# Patient Record
Sex: Female | Born: 1965 | Race: Black or African American | Hispanic: No | Marital: Single | Smoking: Never smoker
Health system: Southern US, Community
[De-identification: ages and names within clinical notes are randomized; demographics above are authoritative.]

## PROBLEM LIST (undated history)

## (undated) DIAGNOSIS — E669 Obesity, unspecified: Secondary | ICD-10-CM

## (undated) DIAGNOSIS — L709 Acne, unspecified: Secondary | ICD-10-CM

## (undated) DIAGNOSIS — E785 Hyperlipidemia, unspecified: Secondary | ICD-10-CM

## (undated) DIAGNOSIS — T7840XA Allergy, unspecified, initial encounter: Secondary | ICD-10-CM

## (undated) HISTORY — DX: Acne, unspecified: L70.9

## (undated) HISTORY — DX: Hyperlipidemia, unspecified: E78.5

## (undated) HISTORY — DX: Allergy, unspecified, initial encounter: T78.40XA

## (undated) HISTORY — DX: Obesity, unspecified: E66.9

---

## 1998-04-13 ENCOUNTER — Other Ambulatory Visit: Admission: RE | Admit: 1998-04-13 | Discharge: 1998-04-13 | Payer: Self-pay | Admitting: *Deleted

## 1999-04-12 ENCOUNTER — Other Ambulatory Visit: Admission: RE | Admit: 1999-04-12 | Discharge: 1999-04-12 | Payer: Self-pay | Admitting: *Deleted

## 2000-04-15 ENCOUNTER — Other Ambulatory Visit: Admission: RE | Admit: 2000-04-15 | Discharge: 2000-04-15 | Payer: Self-pay | Admitting: *Deleted

## 2000-05-28 ENCOUNTER — Other Ambulatory Visit: Admission: RE | Admit: 2000-05-28 | Discharge: 2000-05-28 | Payer: Self-pay | Admitting: *Deleted

## 2001-04-17 ENCOUNTER — Other Ambulatory Visit: Admission: RE | Admit: 2001-04-17 | Discharge: 2001-04-17 | Payer: Self-pay | Admitting: *Deleted

## 2002-04-27 ENCOUNTER — Other Ambulatory Visit: Admission: RE | Admit: 2002-04-27 | Discharge: 2002-04-27 | Payer: Self-pay | Admitting: Family Medicine

## 2003-06-29 ENCOUNTER — Other Ambulatory Visit: Admission: RE | Admit: 2003-06-29 | Discharge: 2003-06-29 | Payer: Self-pay | Admitting: Family Medicine

## 2003-11-07 ENCOUNTER — Other Ambulatory Visit: Admission: RE | Admit: 2003-11-07 | Discharge: 2003-11-07 | Payer: Self-pay | Admitting: Obstetrics and Gynecology

## 2004-12-14 ENCOUNTER — Ambulatory Visit: Payer: Self-pay | Admitting: Family Medicine

## 2005-02-28 ENCOUNTER — Other Ambulatory Visit: Admission: RE | Admit: 2005-02-28 | Discharge: 2005-02-28 | Payer: Self-pay | Admitting: Family Medicine

## 2005-02-28 ENCOUNTER — Ambulatory Visit: Payer: Self-pay | Admitting: Family Medicine

## 2005-02-28 ENCOUNTER — Encounter: Payer: Self-pay | Admitting: Family Medicine

## 2005-03-15 ENCOUNTER — Encounter: Admission: RE | Admit: 2005-03-15 | Discharge: 2005-03-15 | Payer: Self-pay | Admitting: Family Medicine

## 2005-04-04 ENCOUNTER — Encounter: Admission: RE | Admit: 2005-04-04 | Discharge: 2005-04-04 | Payer: Self-pay | Admitting: Family Medicine

## 2005-04-12 ENCOUNTER — Ambulatory Visit: Payer: Self-pay | Admitting: Family Medicine

## 2006-03-18 ENCOUNTER — Encounter: Admission: RE | Admit: 2006-03-18 | Discharge: 2006-03-18 | Payer: Self-pay | Admitting: Family Medicine

## 2006-05-01 ENCOUNTER — Other Ambulatory Visit: Admission: RE | Admit: 2006-05-01 | Discharge: 2006-05-01 | Payer: Self-pay | Admitting: Family Medicine

## 2006-05-01 ENCOUNTER — Encounter: Payer: Self-pay | Admitting: Family Medicine

## 2006-05-01 ENCOUNTER — Ambulatory Visit: Payer: Self-pay | Admitting: Family Medicine

## 2006-05-01 LAB — CONVERTED CEMR LAB
ALT: 18 units/L (ref 0–40)
Albumin: 3.5 g/dL (ref 3.5–5.2)
Alkaline Phosphatase: 40 units/L (ref 39–117)
BUN: 13 mg/dL (ref 6–23)
Basophils Absolute: 0 10*3/uL (ref 0.0–0.1)
Cholesterol: 237 mg/dL (ref 0–200)
Direct LDL: 148.1 mg/dL
Eosinophils Relative: 1.2 % (ref 0.0–5.0)
Glucose, Bld: 86 mg/dL (ref 70–99)
HCT: 37.4 % (ref 36.0–46.0)
HDL: 72 mg/dL (ref >39.0)
MCHC: 34.9 g/dL (ref 30.0–36.0)
Monocytes Absolute: 0.4 10*3/uL (ref 0.2–0.7)
Neutro Abs: 3.2 10*3/uL (ref 1.4–7.7)
Neutrophils Relative %: 72.5 % (ref 43.0–77.0)
Pap Smear: NORMAL
Total Bilirubin: 0.8 mg/dL (ref 0.3–1.2)
Total Protein: 6.6 g/dL (ref 6.0–8.3)
VLDL: 29 mg/dL (ref 0–40)
WBC: 4.5 10*3/uL (ref 4.5–10.5)

## 2007-03-24 ENCOUNTER — Encounter: Admission: RE | Admit: 2007-03-24 | Discharge: 2007-03-24 | Payer: Self-pay | Admitting: Family Medicine

## 2007-03-26 ENCOUNTER — Encounter (INDEPENDENT_AMBULATORY_CARE_PROVIDER_SITE_OTHER): Payer: Self-pay | Admitting: *Deleted

## 2007-05-01 ENCOUNTER — Encounter: Payer: Self-pay | Admitting: Family Medicine

## 2007-05-01 DIAGNOSIS — L708 Other acne: Secondary | ICD-10-CM

## 2007-05-01 DIAGNOSIS — J309 Allergic rhinitis, unspecified: Secondary | ICD-10-CM | POA: Insufficient documentation

## 2007-05-01 DIAGNOSIS — E785 Hyperlipidemia, unspecified: Secondary | ICD-10-CM | POA: Insufficient documentation

## 2007-05-04 ENCOUNTER — Ambulatory Visit: Payer: Self-pay | Admitting: Family Medicine

## 2007-05-04 ENCOUNTER — Encounter: Payer: Self-pay | Admitting: Family Medicine

## 2007-05-04 ENCOUNTER — Other Ambulatory Visit: Admission: RE | Admit: 2007-05-04 | Discharge: 2007-05-04 | Payer: Self-pay | Admitting: Family Medicine

## 2007-05-07 LAB — CONVERTED CEMR LAB
ALT: 23 units/L (ref 0–35)
Albumin: 3.8 g/dL (ref 3.5–5.2)
Alkaline Phosphatase: 37 units/L — ABNORMAL LOW (ref 39–117)
BUN: 7 mg/dL (ref 6–23)
Basophils Absolute: 0 10*3/uL (ref 0.0–0.1)
Basophils Relative: 0 % (ref 0.0–1.0)
Bilirubin, Direct: 0.1 mg/dL (ref 0.0–0.3)
Creatinine, Ser: 0.7 mg/dL (ref 0.4–1.2)
GFR calc Af Amer: 117 mL/min
GFR calc non Af Amer: 97 mL/min
Hemoglobin: 12.5 g/dL (ref 12.0–15.0)
MCHC: 33 g/dL (ref 30.0–36.0)
MCV: 96.3 fL (ref 78.0–100.0)
Neutrophils Relative %: 69.9 % (ref 43.0–77.0)
Platelets: 226 10*3/uL (ref 150–400)
Potassium: 3.8 meq/L (ref 3.5–5.1)
RBC: 3.95 M/uL (ref 3.87–5.11)
RDW: 11.9 % (ref 11.5–14.6)
Sodium: 139 meq/L (ref 135–145)
Total Bilirubin: 0.7 mg/dL (ref 0.3–1.2)
Total CHOL/HDL Ratio: 4.2
Total Protein: 7.2 g/dL (ref 6.0–8.3)
Triglycerides: 144 mg/dL (ref 0–149)

## 2007-05-11 ENCOUNTER — Encounter (INDEPENDENT_AMBULATORY_CARE_PROVIDER_SITE_OTHER): Payer: Self-pay | Admitting: *Deleted

## 2007-07-06 ENCOUNTER — Ambulatory Visit: Payer: Self-pay | Admitting: Family Medicine

## 2007-07-08 LAB — CONVERTED CEMR LAB
Cholesterol: 271 mg/dL (ref 0–200)
HDL: 61.4 mg/dL (ref >39.0)

## 2007-08-03 ENCOUNTER — Ambulatory Visit: Payer: Self-pay | Admitting: Family Medicine

## 2007-08-03 DIAGNOSIS — E669 Obesity, unspecified: Secondary | ICD-10-CM

## 2007-09-08 ENCOUNTER — Ambulatory Visit: Payer: Self-pay | Admitting: Family Medicine

## 2007-09-09 LAB — CONVERTED CEMR LAB
ALT: 28 units/L (ref 0–35)
Cholesterol: 223 mg/dL (ref 0–200)
Direct LDL: 136.1 mg/dL
HDL: 62.9 mg/dL (ref >39.0)
Total CHOL/HDL Ratio: 3.5
Triglycerides: 137 mg/dL (ref 0–149)
VLDL: 27 mg/dL (ref 0–40)

## 2007-12-08 ENCOUNTER — Ambulatory Visit: Payer: Self-pay | Admitting: Family Medicine

## 2007-12-11 LAB — CONVERTED CEMR LAB
ALT: 21 units/L (ref 0–35)
Direct LDL: 131.2 mg/dL

## 2008-03-24 ENCOUNTER — Encounter: Admission: RE | Admit: 2008-03-24 | Discharge: 2008-03-24 | Payer: Self-pay | Admitting: Family Medicine

## 2008-03-25 ENCOUNTER — Encounter (INDEPENDENT_AMBULATORY_CARE_PROVIDER_SITE_OTHER): Payer: Self-pay | Admitting: *Deleted

## 2008-05-04 ENCOUNTER — Other Ambulatory Visit: Admission: RE | Admit: 2008-05-04 | Discharge: 2008-05-04 | Payer: Self-pay | Admitting: Family Medicine

## 2008-05-04 ENCOUNTER — Encounter: Payer: Self-pay | Admitting: Family Medicine

## 2008-05-04 ENCOUNTER — Ambulatory Visit: Payer: Self-pay | Admitting: Family Medicine

## 2008-06-17 ENCOUNTER — Ambulatory Visit: Payer: Self-pay | Admitting: Family Medicine

## 2008-06-20 LAB — CONVERTED CEMR LAB
ALT: 17 units/L (ref 0–35)
Albumin: 3.5 g/dL (ref 3.5–5.2)
Basophils Relative: 0.1 % (ref 0.0–3.0)
Bilirubin, Direct: 0 mg/dL (ref 0.0–0.3)
CO2: 27 meq/L (ref 19–32)
Chloride: 110 meq/L (ref 96–112)
Eosinophils Absolute: 0.1 10*3/uL (ref 0.0–0.7)
Eosinophils Relative: 1.7 % (ref 0.0–5.0)
GFR calc non Af Amer: 116.55 mL/min (ref >60)
Glucose, Bld: 74 mg/dL (ref 70–99)
Lymphocytes Relative: 23.8 % (ref 12.0–46.0)
Platelets: 172 10*3/uL (ref 150.0–400.0)
RDW: 11.4 % — ABNORMAL LOW (ref 11.5–14.6)
Sodium: 139 meq/L (ref 135–145)
TSH: 2.9 microintl units/mL (ref 0.35–5.50)
Total CHOL/HDL Ratio: 4
Total Protein: 6.6 g/dL (ref 6.0–8.3)

## 2008-08-10 ENCOUNTER — Encounter: Payer: Self-pay | Admitting: Family Medicine

## 2008-08-10 ENCOUNTER — Ambulatory Visit: Payer: Self-pay | Admitting: Family Medicine

## 2008-08-10 ENCOUNTER — Other Ambulatory Visit: Admission: RE | Admit: 2008-08-10 | Discharge: 2008-08-10 | Payer: Self-pay | Admitting: Family Medicine

## 2008-08-10 DIAGNOSIS — R87610 Atypical squamous cells of undetermined significance on cytologic smear of cervix (ASC-US): Secondary | ICD-10-CM

## 2008-08-16 ENCOUNTER — Encounter (INDEPENDENT_AMBULATORY_CARE_PROVIDER_SITE_OTHER): Payer: Self-pay | Admitting: *Deleted

## 2008-09-22 ENCOUNTER — Ambulatory Visit: Payer: Self-pay | Admitting: Family Medicine

## 2008-09-26 LAB — CONVERTED CEMR LAB
ALT: 18 units/L (ref 0–35)
Cholesterol: 254 mg/dL — ABNORMAL HIGH (ref 0–200)
Direct LDL: 176.2 mg/dL
Triglycerides: 94 mg/dL (ref 0.0–149.0)

## 2008-11-16 ENCOUNTER — Ambulatory Visit: Payer: Self-pay | Admitting: Family Medicine

## 2008-11-17 LAB — CONVERTED CEMR LAB
HDL: 68.3 mg/dL (ref >39.00)
LDL Cholesterol: 108 mg/dL — ABNORMAL HIGH (ref 0–99)
Triglycerides: 78 mg/dL (ref 0.0–149.0)
VLDL: 15.6 mg/dL (ref 0.0–40.0)

## 2009-03-29 ENCOUNTER — Encounter: Admission: RE | Admit: 2009-03-29 | Discharge: 2009-03-29 | Payer: Self-pay | Admitting: Family Medicine

## 2009-03-30 ENCOUNTER — Encounter (INDEPENDENT_AMBULATORY_CARE_PROVIDER_SITE_OTHER): Payer: Self-pay | Admitting: *Deleted

## 2009-04-25 ENCOUNTER — Telehealth (INDEPENDENT_AMBULATORY_CARE_PROVIDER_SITE_OTHER): Payer: Self-pay | Admitting: *Deleted

## 2009-05-17 ENCOUNTER — Ambulatory Visit: Payer: Self-pay | Admitting: Family Medicine

## 2009-05-17 LAB — CONVERTED CEMR LAB
ALT: 16 units/L (ref 0–35)
AST: 15 units/L (ref 0–37)
Albumin: 3.5 g/dL (ref 3.5–5.2)
BUN: 7 mg/dL (ref 6–23)
Basophils Absolute: 0 10*3/uL (ref 0.0–0.1)
Cholesterol: 170 mg/dL (ref 0–200)
Eosinophils Absolute: 0.1 10*3/uL (ref 0.0–0.7)
Eosinophils Relative: 2.5 % (ref 0.0–5.0)
HDL: 54.8 mg/dL (ref >39.00)
MCV: 94.4 fL (ref 78.0–100.0)
Monocytes Absolute: 0.4 10*3/uL (ref 0.1–1.0)
Neutro Abs: 3.2 10*3/uL (ref 1.4–7.7)
Neutrophils Relative %: 65.2 % (ref 43.0–77.0)
Platelets: 225 10*3/uL (ref 150.0–400.0)
RBC: 3.84 M/uL — ABNORMAL LOW (ref 3.87–5.11)
RDW: 12.4 % (ref 11.5–14.6)
Sodium: 142 meq/L (ref 135–145)
TSH: 1.45 microintl units/mL (ref 0.35–5.50)
Triglycerides: 155 mg/dL — ABNORMAL HIGH (ref 0.0–149.0)

## 2009-06-21 ENCOUNTER — Ambulatory Visit: Payer: Self-pay | Admitting: Family Medicine

## 2009-06-21 ENCOUNTER — Other Ambulatory Visit: Admission: RE | Admit: 2009-06-21 | Discharge: 2009-06-21 | Payer: Self-pay | Admitting: Family Medicine

## 2009-06-21 LAB — HM PAP SMEAR: HM Pap smear: NORMAL

## 2009-07-03 LAB — CONVERTED CEMR LAB: Pap Smear: NEGATIVE

## 2010-02-18 ENCOUNTER — Encounter: Payer: Self-pay | Admitting: Family Medicine

## 2010-02-20 ENCOUNTER — Other Ambulatory Visit: Payer: Self-pay | Admitting: Family Medicine

## 2010-02-20 DIAGNOSIS — Z1239 Encounter for other screening for malignant neoplasm of breast: Secondary | ICD-10-CM

## 2010-02-27 NOTE — Letter (Signed)
Summary: Results Follow up Letter  [place] [facility]  [address] [place], [place] [address]   Phone: [phone]  Fax: [phone]    03/30/2009 MRN: [id number]    Katherine Schwartz [address] [place], [place]  [address]    Dear [name],  The following are the results of your recent test(s):  Test         Result    Pap Smear:        Normal _____  Not Normal _____ Comments: ______________________________________________________ Cholesterol: LDL(Bad cholesterol):         Your goal is less than:         HDL (Good cholesterol):       Your goal is more than: Comments:  ______________________________________________________ Mammogram:        Normal ___X__  Not Normal _____ Comments:  Yearly follow up is recommended.   ___________________________________________________________________ Hemoccult:        Normal _____  Not normal _______ Comments:    _____________________________________________________________________ Other Tests:    We routinely do not discuss normal results over the telephone.  If you desire a copy of the results, or you have any questions about this information we can discuss them at your next office visit.   Sincerely,    Marne A. Milinda Antis, M.D.  MAT:lsf

## 2010-02-27 NOTE — Progress Notes (Signed)
  Phone Note Call from Patient Call back at [facility] Phone [phone]   Caller: Patient Call For: Dr.Tower Summary of Call: Pt. has an appt. for labwork on 05/17/09 to check her cholesterol and liver.  She r/s her cpx appt. to 06/21/09.  Do you want to add anything on to  her labs for her cpx? Initial call taken by: Beau Fanny,  April 25, 2009 11:51 AM  Follow-up for Phone Call        thanks -- please check wellness panel / lipid v70.0, 272  Follow-up by: Judith Part MD,  April 25, 2009 1:22 PM  Additional Follow-up for Phone Call Additional follow up Details #1::        I put in new order on lab appt. for 05/17/09. Additional Follow-up by: Beau Fanny,  April 25, 2009 1:35 PM

## 2010-02-27 NOTE — Assessment & Plan Note (Signed)
Summary: CPX/CLE   Vital Signs:  Patient profile:   45 year old female Height:      62 inches Weight:      179.75 pounds BMI:     33.00 Temp:     98.7 degrees F oral Pulse rate:   80 / minute Pulse rhythm:   regular BP sitting:   114 / 78  (left arm) Cuff size:   regular  Vitals Entered By: Lewanda Rife LPN (Jun 21, 2009 9:51 AM) CC: CPX with pap and breast exam LMP 05/31/09   History of Present Illness: here for wellness and to disc chronic health problems   is feeling pretty good   low energy level in general -- diet not the most balanced  is working some extra hours - also may be peri menopausal  more irritable at times   menses - some months are light and then heavy    wt is up 5 lb with bmi of 33 eats fairly healthy  sporatic exercise -- walked a race and likes to climb   bp good today  lipids imp on labs with trig 155/ HDL 54 and LDL 84 (down fro 108)  hx of abn paps  ascus with neg hpv in past  last pap neg  does want to have std tests with pap   mam 3/11 nl  self exam - no lumps at all  td 07  Allergies: 1)  ! * Proactive  Past History:  Past Medical History: Last updated: 08/03/2007 Allergic rhinitis Hyperlipidemia acne allergic rhinitis obesity  Past Surgical History: Last updated: 08/10/2008 Caesarean section Abnormal paps- with cryo in past  ascus pap with neg hpv 2010  Family History: Last updated: 05/04/2007 Father:  Mother: HTN, chol Siblings:  GM HTN, colon ca Paunt breast cancer Maunt cervical cancer   Social History: Last updated: 05/04/2008 Marital Status: Married Children: 1 daughter Occupation: case Production designer, theatre/television/film non smoker occ alcohol exercise - varied   Risk Factors: Smoking Status: current (05/01/2007)  Review of Systems General:  Complains of fatigue; denies loss of appetite and malaise. Eyes:  Denies blurring and eye pain. CV:  Denies chest pain or discomfort, lightheadness, palpitations, and swelling of  feet. Resp:  Denies cough, shortness of breath, and wheezing. GI:  Denies abdominal pain, bloody stools, change in bowel habits, and indigestion. GU:  Denies abnormal vaginal bleeding, discharge, dysuria, and urinary frequency. MS:  Denies joint pain, joint redness, and joint swelling. Derm:  Denies itching, lesion(s), poor wound healing, and rash. Neuro:  Denies numbness, tingling, and weakness. Endo:  Denies cold intolerance, excessive thirst, excessive urination, and heat intolerance. Heme:  Denies abnormal bruising and bleeding.  Physical Exam  General:  overweight but generally well appearing  Head:  normocephalic, atraumatic, and no abnormalities observed.   Eyes:  vision grossly intact, pupils equal, pupils round, and pupils reactive to light.  no conjunctival pallor, injection or icterus  Ears:  R ear normal and L ear normal.   Nose:  no nasal discharge.   Mouth:  pharynx pink and moist.   Neck:  supple with full rom and no masses or thyromegally, no JVD or carotid bruit  Chest Wall:  No deformities, masses, or tenderness noted. Breasts:  No mass, nodules, thickening, tenderness, bulging, retraction, inflamation, nipple discharge or skin changes noted.   Lungs:  Normal respiratory effort, chest expands symmetrically. Lungs are clear to auscultation, no crackles or wheezes. Heart:  Normal rate and regular rhythm. S1 and S2  normal without gallop, murmur, click, rub or other extra sounds. Abdomen:  Bowel sounds positive,abdomen soft and non-tender without masses, organomegaly or hernias noted. no renal bruits  Genitalia:  Normal introitus for age, no external lesions, no vaginal discharge, mucosa pink and moist, no vaginal or cervical lesions, no vaginal atrophy, no friaility or hemorrhage, normal uterus size and position, no adnexal masses or tenderness Msk:  No deformity or scoliosis noted of thoracic or lumbar spine.  no acute joint changes  Pulses:  R and L  carotid,radial,femoral,dorsalis pedis and posterior tibial pulses are full and equal bilaterally Extremities:  No clubbing, cyanosis, edema, or deformity noted with normal full range of motion of all joints.   Neurologic:  sensation intact to light touch, gait normal, and DTRs symmetrical and normal.   Skin:  Intact without suspicious lesions or rashes some lentigos  Cervical Nodes:  No lymphadenopathy noted Axillary Nodes:  No palpable lymphadenopathy Inguinal Nodes:  No significant adenopathy Psych:  normal affect, talkative and pleasant    Impression & Recommendations:  Problem # 1:  HEALTH MAINTENANCE EXAM (ICD-V70.0) Assessment Comment Only  reviewed health habits including diet, exercise and skin cancer prevention reviewed health maintenance list and family history  recommend mvi daily if diet is not balanced rev nl wellness labs today  Orders: Prescription Created Electronically [phone])  Problem # 2:  ROUTINE GYNECOLOGICAL EXAMINATION (ICD-V72.31) Assessment: Comment Only  annual exam  last pap neg  will check std screen in light of new partner disc condom use  renew OC which works well for her  Orders: Administrator, arts [phone])  Problem # 3:  HYPERLIPIDEMIA (ICD-272.4) Assessment: Improved  lipids improved with diet and statin no change in that  lab reviewed in detail today  urged to start exercise  Her updated medication list for this problem includes:    Zocor 20 Mg Tabs (Simvastatin) .[facility]... Take one by mouth daily  Orders: Prescription Created Electronically [phone])  Problem # 4:  SEXUALLY TRANSMITTED DISEASE, EXPOSURE TO (ICD-V01.6) Assessment: New  will do gon and chlam test with pap   Orders: Prescription Created Electronically [phone])  Complete Medication List: 1)  Tri-sprintec 0.035 Mg Tabs (Norgestimate-ethinyl estradiol) .... Take 1 tablet by mouth once a day 2)  Zyrtec-d Allergy & Congestion 5-120 Mg Tb12  (Cetirizine-pseudoephedrine) .... Take one by mouth q am 3)  Retinoic Acid Powd (Tretinoin) .... Use as directed 4)  Hydroquinone 4 % Crea (Hydroquinone) .... Use as directed 5)  Tretinoin Cream  .... Use as directed 6)  Zocor 20 Mg Tabs (Simvastatin) .... Take one by mouth daily  Patient Instructions: 1)  start generic multivitamin daily  2)  I will update you with lab results 3)  keep working on healthy diet and exercise (5 days per week)  Prescriptions: TRI-SPRINTEC 0.035 MG TABS (NORGESTIMATE-ETHINYL ESTRADIOL) Take 1 tablet by mouth once a day  #1 pack x 11   Entered and Authorized by:   Judith Part MD   Signed by:   Judith Part MD on 06/21/2009   Method used:   Electronically to        [facility]       [address]       [place], [place]  [address]       Ph: [phone]       Fax: [phone]   RxID:   [id number]   Current Allergies (reviewed today): ! *  PROACTIVE

## 2010-04-02 ENCOUNTER — Ambulatory Visit
Admission: RE | Admit: 2010-04-02 | Discharge: 2010-04-02 | Disposition: A | Discharge status: Home / Self Care | Payer: 59 | Source: Ambulatory Visit | Attending: Family Medicine | Admitting: Family Medicine

## 2010-04-02 DIAGNOSIS — Z1239 Encounter for other screening for malignant neoplasm of breast: Secondary | ICD-10-CM

## 2010-04-04 ENCOUNTER — Encounter: Payer: Self-pay | Admitting: Family Medicine

## 2010-04-10 NOTE — Letter (Signed)
Summary: Results Follow up Letter  [place] [facility]  [address] [place], [place] [address]   Phone: [phone]  Fax: [phone]    04/04/2010 MRN: [id number]    Katherine Schwartz [address] [place], [place]  [address]    Dear [name],  The following are the results of your recent test(s):  Test         Result    Pap Smear:        Normal _____  Not Normal _____ Comments: ______________________________________________________ Cholesterol: LDL(Bad cholesterol):         Your goal is less than:         HDL (Good cholesterol):       Your goal is more than: Comments:  ______________________________________________________ Mammogram:        Normal _X__  Not Normal _____ Comments:Repeat in one year.   ___________________________________________________________________ Hemoccult:        Normal _____  Not normal _______ Comments:    _____________________________________________________________________ Other Tests:    We routinely do not discuss normal results over the telephone.  If you desire a copy of the results, or you have any questions about this information we can discuss them at your next office visit.   Sincerely,    Idamae Schuller Tower,MD  MT/ri

## 2010-04-18 ENCOUNTER — Other Ambulatory Visit: Payer: Self-pay | Admitting: Family Medicine

## 2010-04-19 NOTE — Telephone Encounter (Signed)
Pt already has appt scheduled with Dr Milinda Antis on 06/26/10.

## 2010-04-26 ENCOUNTER — Other Ambulatory Visit: Payer: Self-pay | Admitting: Family Medicine

## 2010-04-26 NOTE — Telephone Encounter (Signed)
Pt already scheduled for CPX with Dr Milinda Antis 06/26/10.

## 2010-06-16 ENCOUNTER — Encounter: Payer: Self-pay | Admitting: Family Medicine

## 2010-06-26 ENCOUNTER — Other Ambulatory Visit (HOSPITAL_COMMUNITY)
Admission: RE | Admit: 2010-06-26 | Discharge: 2010-06-26 | Disposition: A | Discharge status: Home / Self Care | Payer: 59 | Source: Ambulatory Visit | Attending: Family Medicine | Admitting: Family Medicine

## 2010-06-26 ENCOUNTER — Encounter: Payer: Self-pay | Admitting: Family Medicine

## 2010-06-26 ENCOUNTER — Ambulatory Visit (INDEPENDENT_AMBULATORY_CARE_PROVIDER_SITE_OTHER): Payer: 59 | Admitting: Family Medicine

## 2010-06-26 DIAGNOSIS — Z01419 Encounter for gynecological examination (general) (routine) without abnormal findings: Secondary | ICD-10-CM | POA: Insufficient documentation

## 2010-06-26 DIAGNOSIS — E785 Hyperlipidemia, unspecified: Secondary | ICD-10-CM

## 2010-06-26 DIAGNOSIS — Z0000 Encounter for general adult medical examination without abnormal findings: Secondary | ICD-10-CM

## 2010-06-26 DIAGNOSIS — S139XXA Sprain of joints and ligaments of unspecified parts of neck, initial encounter: Secondary | ICD-10-CM

## 2010-06-26 DIAGNOSIS — S161XXA Strain of muscle, fascia and tendon at neck level, initial encounter: Secondary | ICD-10-CM

## 2010-06-26 LAB — HM PAP SMEAR: HM Pap smear: NORMAL

## 2010-06-26 MED ORDER — SIMVASTATIN 20 MG PO TABS: 20.0000 mg | ORAL_TABLET | Freq: Every day | ORAL | Status: DC

## 2010-06-26 MED ORDER — NORGESTIM-ETH ESTRAD TRIPHASIC 0.18/0.215/0.25 MG-35 MCG PO TABS: ORAL_TABLET | ORAL | Status: DC

## 2010-06-26 NOTE — Assessment & Plan Note (Signed)
Annual exam Remote hx of ascus pap  Some perimenopausal changes in menses On OC without problems - no changes needed

## 2010-06-26 NOTE — Assessment & Plan Note (Signed)
Due for labs today On statin Diet usually good Some beef at lunch - will keep this in mind with result Rev low sat fat diet

## 2010-06-26 NOTE — Assessment & Plan Note (Signed)
Reviewed health habits including diet and exercise and skin cancer prevention Also reviewed health mt list, fam hx and immunizations  Labs today  Enc wt loss with diet and exercise

## 2010-06-26 NOTE — Patient Instructions (Addendum)
Labs today  Avoid red meat/ fried foods/ egg yolks/ fatty breakfast meats/ butter, cheese and high fat dairy/ and shellfish   No change in medicines  Keep working on healthy diet and exercise  Use heat on neck as needed/ consider at cervical support pillow for neck Update me if this does not improve

## 2010-06-26 NOTE — Progress Notes (Signed)
Subjective:    Patient ID: Katherine Schwartz, female    DOB: [birth year], 45 y.o.   MRN: [id number]  HPI Here for wellness exam and to review chronic med problems Is feeling ok overall   Pulled a muscle in her neck - woke up with it / the way she slept  Otherwise doing well   Some perimenopausal symptoms  Hot flashes on and off -- occ throws up if overheated  Cycle was irregular for a few months peroids are different in character too- 2 months ago had a heavy one after missing one No chance pregnant  Is on OC   tD07  Pap 5/11- no gyn problems  No new discharge  Remote hx of ascus Maunt had cervical cancer No new cases   Mam was 04/02/10 and normal Self exam - no lumps or changes   Moles - skin tags are really bothering her  Skin is gettting dryer    On zocor for lipids  Due for labs- today (ate pork and beans)  Diet-- is generally pretty good other than that   Is exercising more -- new dog - walking a mile every day   Wt is up 9 lb with bmi of 34-- is frustrating  Working on diet  Getting junk food out of the house   118/80 good bp  Past Medical History  Diagnosis Date  . Allergy     allergic rhinitis  . Hyperlipidemia   . Acne   . Obesity     History   Social History  . Marital Status: Married    Spouse Name: N/A    Number of Children: N/A  . Years of Education: N/A   Occupational History  . Not on file.   Social History Main Topics  . Smoking status: Never Smoker   . Smokeless tobacco: Not on file  . Alcohol Use: Yes     occasional  . Drug Use:   . Sexually Active:    Other Topics Concern  . Not on file   Social History Narrative  . No narrative on file    Family History  Problem Relation Age of Onset  . Hypertension Mother   . Hyperlipidemia Mother   . Cancer Maternal Aunt     cervical CA  . Cancer Paternal Aunt     breast CA    Past Surgical History  Procedure Date  . Cesarean section    No Known Allergies        Review  of Systems Review of Systems  Constitutional: Negative for fever, appetite change, fatigue and unexpected weight change.  Eyes: Negative for pain and visual disturbance.  Respiratory: Negative for cough and shortness of breath.   Cardiovascular: Negative for cp or palpitations or edema.   Gastrointestinal: Negative for nausea, diarrhea and constipation.  Genitourinary: Negative for urgency and frequency.  Skin: Negative for pallor.  MSK pos for neck pain , neg for joint swelling  Neurological: Negative for weakness, light-headedness, numbness and headaches.  Hematological: Negative for adenopathy. Does not bruise/bleed easily.  Psychiatric/Behavioral: Negative for dysphoric mood. The patient is not nervous/anxious.          Objective:   Physical Exam  Constitutional: She appears well-developed and well-nourished. No distress.       overwt and well appearing   HENT:  Head: Normocephalic and atraumatic.  Right Ear: External ear normal.  Left Ear: External ear normal.  Nose: Nose normal.  Mouth/Throat: Oropharynx is clear and moist.  Eyes: Conjunctivae and EOM are normal.  Neck: Normal range of motion. Neck supple. No JVD present. Carotid bruit is not present. No thyromegaly present.       Neck is sore to rotate L -- with mildly tight L paracervical muscles and trapezius No bony tenderness  Cardiovascular: Normal rate, regular rhythm, normal heart sounds and intact distal pulses.   Pulmonary/Chest: Effort normal and breath sounds normal. No respiratory distress. She has no wheezes.  Abdominal: Soft. Bowel sounds are normal. She exhibits no distension, no abdominal bruit and no mass. There is no tenderness.  Genitourinary: Vagina normal and uterus normal. No breast swelling, tenderness, discharge or bleeding. No vaginal discharge found.       Cervix is very posterior- difficult to reach with speculum   Musculoskeletal: Normal range of motion. She exhibits no edema and no tenderness.         See neck exam  Lymphadenopathy:    She has no cervical adenopathy.  Neurological: She is alert. She has normal strength and normal reflexes. No cranial nerve deficit. Coordination normal.  Skin: Skin is warm and dry. No rash noted. No erythema. No pallor.  Psychiatric: She has a normal mood and affect.          Assessment & Plan:

## 2010-06-27 LAB — COMPREHENSIVE METABOLIC PANEL
Albumin: 3.6 g/dL (ref 3.5–5.2)
Alkaline Phosphatase: 40 U/L (ref 39–117)
CO2: 25 mEq/L (ref 19–32)
Calcium: 9.2 mg/dL (ref 8.4–10.5)
Chloride: 106 mEq/L (ref 96–112)
GFR: 140.69 mL/min (ref >60.00)
Glucose, Bld: 87 mg/dL (ref 70–99)
Potassium: 4.1 mEq/L (ref 3.5–5.1)
Sodium: 136 mEq/L (ref 135–145)
Total Protein: 6.9 g/dL (ref 6.0–8.3)

## 2010-06-27 LAB — CBC WITH DIFFERENTIAL/PLATELET
Eosinophils Relative: 1.3 % (ref 0.0–5.0)
Lymphocytes Relative: 17.7 % (ref 12.0–46.0)
Monocytes Relative: 7.6 % (ref 3.0–12.0)
Neutrophils Relative %: 73.3 % (ref 43.0–77.0)
Platelets: 216 10*3/uL (ref 150.0–400.0)
RBC: 3.85 Mil/uL — ABNORMAL LOW (ref 3.87–5.11)
WBC: 4 10*3/uL — ABNORMAL LOW (ref 4.5–10.5)

## 2010-06-27 LAB — TSH: TSH: 0.89 u[IU]/mL (ref 0.35–5.50)

## 2010-06-27 LAB — LIPID PANEL: VLDL: 33 mg/dL (ref 0.0–40.0)

## 2010-06-28 DIAGNOSIS — S161XXA Strain of muscle, fascia and tendon at neck level, initial encounter: Secondary | ICD-10-CM | POA: Insufficient documentation

## 2010-06-28 NOTE — Assessment & Plan Note (Signed)
Neck strain possibly from poor sleep position Is slowly improving  No neurologic s/s  Adv use of gentle heat and stretches  Analgesics prn  Cervical support pillow  Update if not improved in several weeks or if worse

## 2011-03-08 ENCOUNTER — Other Ambulatory Visit: Payer: Self-pay | Admitting: Family Medicine

## 2011-03-08 DIAGNOSIS — Z1231 Encounter for screening mammogram for malignant neoplasm of breast: Secondary | ICD-10-CM

## 2011-04-04 ENCOUNTER — Ambulatory Visit
Admission: RE | Admit: 2011-04-04 | Discharge: 2011-04-04 | Disposition: A | Discharge status: Home / Self Care | Payer: Managed Care, Other (non HMO) | Source: Ambulatory Visit | Attending: Family Medicine | Admitting: Family Medicine

## 2011-04-04 DIAGNOSIS — Z1231 Encounter for screening mammogram for malignant neoplasm of breast: Secondary | ICD-10-CM

## 2011-04-08 ENCOUNTER — Encounter: Payer: Self-pay | Admitting: *Deleted

## 2011-06-28 ENCOUNTER — Ambulatory Visit (INDEPENDENT_AMBULATORY_CARE_PROVIDER_SITE_OTHER): Payer: Managed Care, Other (non HMO) | Admitting: Family Medicine

## 2011-06-28 ENCOUNTER — Encounter: Payer: Self-pay | Admitting: Family Medicine

## 2011-06-28 VITALS — BP 110/80 | HR 87 | Temp 98.0°F | Ht 62.25 in | Wt 190.5 lb

## 2011-06-28 DIAGNOSIS — Z01419 Encounter for gynecological examination (general) (routine) without abnormal findings: Secondary | ICD-10-CM

## 2011-06-28 DIAGNOSIS — E785 Hyperlipidemia, unspecified: Secondary | ICD-10-CM

## 2011-06-28 DIAGNOSIS — Z0000 Encounter for general adult medical examination without abnormal findings: Secondary | ICD-10-CM

## 2011-06-28 MED ORDER — SIMVASTATIN 20 MG PO TABS: 20.0000 mg | ORAL_TABLET | Freq: Every day | ORAL | Status: DC

## 2011-06-28 MED ORDER — NORGESTIM-ETH ESTRAD TRIPHASIC 0.18/0.215/0.25 MG-35 MCG PO TABS: ORAL_TABLET | ORAL | Status: DC

## 2011-06-28 NOTE — Assessment & Plan Note (Signed)
Pap done Starting menses today Remote hx of ascus pap No c/o Refilled OC  Some perimen menses changes beginning

## 2011-06-28 NOTE — Patient Instructions (Signed)
Keep working on [employer] and exercise  Use sun protection  Use dove soap for sensitive skin or ivory to avoid eczema  Also use moisturizers without color or fragrance  Avoid red meat/ fried foods/ egg yolks/ fatty breakfast meats/ butter, cheese and high fat dairy/ and shellfish   Get lean protein with every meal like fish/ low fat cheese or yogurt/ nuts/ soy

## 2011-06-28 NOTE — Progress Notes (Signed)
Subjective:    Patient ID: Katherine Schwartz, female    DOB: [birth year], 46 y.o.   MRN: [id number]  HPI Here for health maintenance exam and to review chronic medical problems   Is feeling ok overall   Has adj diet -- but still eating more carbs  Avoiding any meat but fish  Still eating dairy  Some soy  Some hummus   bp is 110/80 good  Wt is up 2 lb with bmi of 34 Is starting to exercise -- zumba on her wii Trying to get more active  Did the march of dimes walk   Lipids-zocor and diet  Lab Results  Component Value Date   CHOL 180 06/26/2010   HDL 64.00 06/26/2010   LDLCALC 83 06/26/2010   LDLDIRECT 176.2 09/22/2008   TRIG 165.0* 06/26/2010   CHOLHDL 3 06/26/2010   due for labs   Flu shot--did get in the fall   mammo 3/13 No breast lumps or changes on self exam   Pap 5/12 normal  Remote hx of ascus Has not been to gyn  Her menses are changing - some spotting at end of the month / and some heavy and some light  Not missing her pills  No hot flashes but does not tolerate extreme temp as well   utd Td  Patient Active Problem List  Diagnoses  . HYPERLIPIDEMIA  . OBESITY  . ALLERGIC RHINITIS  . ACNE VULGARIS  . ASCUS PAP  . Routine general medical examination at a health care facility  . Gynecological examination  . Neck strain   Past Medical History  Diagnosis Date  . Allergy     allergic rhinitis  . Hyperlipidemia   . Acne   . Obesity    Past Surgical History  Procedure Date  . Cesarean section    History  Substance Use Topics  . Smoking status: Never Smoker   . Smokeless tobacco: Not on file  . Alcohol Use: Yes     occasional   Family History  Problem Relation Age of Onset  . Hypertension Mother   . Hyperlipidemia Mother   . Cancer Maternal Aunt     cervical CA  . Cancer Paternal Aunt     breast CA   No Known Allergies Current Outpatient Prescriptions on File Prior to Visit  Medication Sig Dispense Refill  . cetirizine-pseudoephedrine  (ZYRTEC-D ALLERGY & CONGESTION) 5-120 MG per tablet Take 1 tablet by mouth daily.        . hydroquinone 4 % cream As directed.       Lorita Officer Triphasic (TRI-SPRINTEC) 0.18/0.215/0.25 MG-35 MCG TABS One tablet by mouth as directed daily   28 tablet  11  . simvastatin (ZOCOR) 20 MG tablet Take 1 tablet (20 mg total) by mouth at bedtime.  30 tablet  11  . tretinoin (RETIN-A) 0.025 % cream Apply 1 application topically at bedtime.        . Tretinoin (RETINOIC ACID) POWD as directed.            Review of Systems Review of Systems  Constitutional: Negative for fever, appetite change, fatigue and unexpected weight change.  Eyes: Negative for pain and visual disturbance.  Respiratory: Negative for cough and shortness of breath.   Cardiovascular: Negative for cp or palpitations    Gastrointestinal: Negative for nausea, diarrhea and constipation.  Genitourinary: Negative for urgency and frequency.  Skin: Negative for pallor , pos for some eczema on legs  Neurological: Negative for  weakness, light-headedness, numbness and headaches.  Hematological: Negative for adenopathy. Does not bruise/bleed easily.  Psychiatric/Behavioral: Negative for dysphoric mood. The patient is not nervous/anxious.         Objective:   Physical Exam  Constitutional: She appears well-developed and well-nourished. No distress.       overwt and well appearing   HENT:  Head: Normocephalic and atraumatic.  Right Ear: External ear normal.  Left Ear: External ear normal.  Nose: Nose normal.  Mouth/Throat: Oropharynx is clear and moist.  Eyes: Conjunctivae and EOM are normal. Pupils are equal, round, and reactive to light. No scleral icterus.  Neck: Normal range of motion. Neck supple. No JVD present. Carotid bruit is not present. No thyromegaly present.  Cardiovascular: Normal rate, regular rhythm, normal heart sounds and intact distal pulses.  Exam reveals no gallop.   Pulmonary/Chest: Effort normal and  breath sounds normal. No respiratory distress. She has no wheezes.  Abdominal: Soft. Bowel sounds are normal. She exhibits no distension and no abdominal bruit. There is no tenderness.  Genitourinary: Vagina normal and uterus normal. No breast swelling, tenderness, discharge or bleeding. There is no rash or tenderness on the right labia. There is no rash or tenderness on the left labia. Uterus is not enlarged and not tender. Cervix exhibits no motion tenderness, no discharge and no friability. Right adnexum displays no mass, no tenderness and no fullness. Left adnexum displays no mass, no tenderness and no fullness. No bleeding around the vagina. No vaginal discharge found.       Breast exam: No mass, nodules, thickening, tenderness, bulging, retraction, inflamation, nipple discharge or skin changes noted.  No axillary or clavicular LA.  Chaperoned exam.    Musculoskeletal: Normal range of motion. She exhibits no edema and no tenderness.  Lymphadenopathy:    She has no cervical adenopathy.  Neurological: She is alert. She has normal reflexes. No cranial nerve deficit. She exhibits normal muscle tone. Coordination normal.  Skin: Skin is warm and dry. No rash noted. No erythema. No pallor.  Psychiatric: She has a normal mood and affect.          Assessment & Plan:

## 2011-06-28 NOTE — Assessment & Plan Note (Signed)
Reviewed health habits including diet and exercise and skin cancer prevention Also reviewed health mt list, fam hx and immunizations  Enc wt loss since she has DM in family Wellness labs today

## 2011-06-28 NOTE — Assessment & Plan Note (Signed)
On zocor and diet Statin refilled- no problems Lab today Rev low sat fat diet in detail

## 2011-06-29 LAB — COMPREHENSIVE METABOLIC PANEL
Albumin: 4.3 g/dL (ref 3.5–5.2)
BUN: 9 mg/dL (ref 6–23)
CO2: 26 mEq/L (ref 19–32)
Calcium: 9.9 mg/dL (ref 8.4–10.5)
Chloride: 105 mEq/L (ref 96–112)
Creat: 0.64 mg/dL (ref 0.50–1.10)
Glucose, Bld: 79 mg/dL (ref 70–99)
Potassium: 3.7 mEq/L (ref 3.5–5.3)

## 2011-06-29 LAB — CBC WITH DIFFERENTIAL/PLATELET
Eosinophils Relative: 2 % (ref 0–5)
HCT: 39.3 % (ref 36.0–46.0)
Hemoglobin: 13.1 g/dL (ref 12.0–15.0)
Lymphocytes Relative: 34 % (ref 12–46)
Lymphs Abs: 1.4 10*3/uL (ref 0.7–4.0)
MCV: 94.2 fL (ref 78.0–100.0)
Monocytes Absolute: 0.3 10*3/uL (ref 0.1–1.0)
Monocytes Relative: 7 % (ref 3–12)
RDW: 12.8 % (ref 11.5–15.5)
WBC: 4.1 10*3/uL (ref 4.0–10.5)

## 2011-06-29 LAB — LIPID PANEL
HDL: 71 mg/dL (ref >39)
Triglycerides: 83 mg/dL (ref <150)

## 2011-06-29 LAB — TSH: TSH: 0.765 u[IU]/mL (ref 0.350–4.500)

## 2011-07-01 ENCOUNTER — Encounter: Payer: Self-pay | Admitting: *Deleted

## 2011-07-01 ENCOUNTER — Other Ambulatory Visit (HOSPITAL_COMMUNITY)
Admission: RE | Admit: 2011-07-01 | Discharge: 2011-07-01 | Disposition: A | Discharge status: Home / Self Care | Payer: Managed Care, Other (non HMO) | Source: Ambulatory Visit | Attending: Family Medicine | Admitting: Family Medicine

## 2012-02-14 ENCOUNTER — Encounter: Payer: Self-pay | Admitting: Family Medicine

## 2012-02-14 ENCOUNTER — Ambulatory Visit (INDEPENDENT_AMBULATORY_CARE_PROVIDER_SITE_OTHER): Payer: Managed Care, Other (non HMO) | Admitting: Family Medicine

## 2012-02-14 VITALS — BP 128/82 | HR 88 | Temp 98.6°F | Wt 191.8 lb

## 2012-02-14 DIAGNOSIS — J019 Acute sinusitis, unspecified: Secondary | ICD-10-CM | POA: Insufficient documentation

## 2012-02-14 MED ORDER — AMOXICILLIN-POT CLAVULANATE 875-125 MG PO TABS: 1.0000 | ORAL_TABLET | Freq: Two times a day (BID) | ORAL | Status: AC

## 2012-02-14 NOTE — Patient Instructions (Signed)
You have a sinus infection. Push fluids and plenty of rest. Nasal saline irrigation or neti pot to help drain sinuses. May use simple mucinex or guaifenesin with plenty of fluid to help mobilize mucous. Ibuprofen for sinus inflammation/headache (400-600mg  with food 1-2 x/day) Fill augmentin if not improving in next 2-3 days or any sudden worsening. Let us know if not feeling better with above.

## 2012-02-14 NOTE — Assessment & Plan Note (Signed)
Given duration, anticipate more viral sinusitis. See pt instructions for plan. WASP script for augmentin provided with reasons to fill.

## 2012-02-14 NOTE — Progress Notes (Signed)
  Subjective:    Patient ID: Katherine Schwartz, female    DOB: [birth year], 47 y.o.   MRN: [id number]  HPI CC: sinus pressure  1 wk h/o chills, frontal sinus pressure headache as well as pressure around ears, 1d h/o ST, nauseated intermittently, PNDrainage > congestion.  So far has taken zyrtec D, nyquil at night.  No fevers, coughing, rhinorrhea, tooth pain, rashes.  No smokers at home. + sick contacts at work No h/o asthma.  + allergic rhinitis.  Past Medical History  Diagnosis Date  . Allergy     allergic rhinitis  . Hyperlipidemia   . Acne   . Obesity      Review of Systems Per HPI    Objective:   Physical Exam  Nursing note and vitals reviewed. Constitutional: She appears well-developed and well-nourished. No distress.  HENT:  Head: Normocephalic and atraumatic.  Right Ear: Hearing, tympanic membrane, external ear and ear canal normal.  Left Ear: Hearing, tympanic membrane, external ear and ear canal normal.  Nose: No mucosal edema or rhinorrhea. Right sinus exhibits no maxillary sinus tenderness and no frontal sinus tenderness. Left sinus exhibits maxillary sinus tenderness (mild). Left sinus exhibits no frontal sinus tenderness.  Mouth/Throat: Uvula is midline, oropharynx is clear and moist and mucous membranes are normal. No oropharyngeal exudate, posterior oropharyngeal edema, posterior oropharyngeal erythema or tonsillar abscesses.  Eyes: Conjunctivae normal and EOM are normal. Pupils are equal, round, and reactive to light. No scleral icterus.  Neck: Normal range of motion. Neck supple.  Cardiovascular: Normal rate, regular rhythm, normal heart sounds and intact distal pulses.   No murmur heard. Pulmonary/Chest: Effort normal and breath sounds normal. No respiratory distress. She has no wheezes. She has no rales.  Lymphadenopathy:    She has no cervical adenopathy.  Skin: Skin is warm and dry. No rash noted.       Assessment & Plan:

## 2012-03-04 ENCOUNTER — Other Ambulatory Visit: Payer: Self-pay | Admitting: Family Medicine

## 2012-03-04 DIAGNOSIS — Z1231 Encounter for screening mammogram for malignant neoplasm of breast: Secondary | ICD-10-CM

## 2012-04-06 ENCOUNTER — Ambulatory Visit
Admission: RE | Admit: 2012-04-06 | Discharge: 2012-04-06 | Disposition: A | Discharge status: Home / Self Care | Payer: Managed Care, Other (non HMO) | Source: Ambulatory Visit | Attending: Family Medicine | Admitting: Family Medicine

## 2012-04-06 DIAGNOSIS — Z1231 Encounter for screening mammogram for malignant neoplasm of breast: Secondary | ICD-10-CM

## 2012-04-07 ENCOUNTER — Telehealth: Payer: Self-pay | Admitting: Family Medicine

## 2012-04-07 ENCOUNTER — Encounter: Payer: Self-pay | Admitting: *Deleted

## 2012-04-07 NOTE — Telephone Encounter (Signed)
Patient has a cpx scheduled in July.  Patient's [employer]. Told her that she has to have her cpx done before July, or it will mess up her premiums with her new insurance policy that begins in July.  Patient is asking if you can see her sometime in June for her cpx.  Please advise.

## 2012-04-07 NOTE — Telephone Encounter (Signed)
CPE rescheduled to 07/07/12

## 2012-04-07 NOTE — Telephone Encounter (Signed)
Please schedule in any 30 min avail time

## 2012-06-07 ENCOUNTER — Other Ambulatory Visit: Payer: Self-pay | Admitting: Family Medicine

## 2012-06-11 ENCOUNTER — Other Ambulatory Visit: Payer: Self-pay | Admitting: Family Medicine

## 2012-07-07 ENCOUNTER — Ambulatory Visit (INDEPENDENT_AMBULATORY_CARE_PROVIDER_SITE_OTHER): Payer: Managed Care, Other (non HMO) | Admitting: Family Medicine

## 2012-07-07 ENCOUNTER — Other Ambulatory Visit (HOSPITAL_COMMUNITY)
Admission: RE | Admit: 2012-07-07 | Discharge: 2012-07-07 | Disposition: A | Discharge status: Home / Self Care | Payer: Managed Care, Other (non HMO) | Source: Ambulatory Visit | Attending: Family Medicine | Admitting: Family Medicine

## 2012-07-07 ENCOUNTER — Encounter: Payer: Self-pay | Admitting: Family Medicine

## 2012-07-07 VITALS — BP 118/68 | HR 90 | Temp 98.6°F | Ht 61.75 in | Wt 188.5 lb

## 2012-07-07 DIAGNOSIS — Z1151 Encounter for screening for human papillomavirus (HPV): Secondary | ICD-10-CM | POA: Insufficient documentation

## 2012-07-07 DIAGNOSIS — Z0000 Encounter for general adult medical examination without abnormal findings: Secondary | ICD-10-CM

## 2012-07-07 DIAGNOSIS — E669 Obesity, unspecified: Secondary | ICD-10-CM

## 2012-07-07 DIAGNOSIS — Z01419 Encounter for gynecological examination (general) (routine) without abnormal findings: Secondary | ICD-10-CM | POA: Insufficient documentation

## 2012-07-07 DIAGNOSIS — E785 Hyperlipidemia, unspecified: Secondary | ICD-10-CM

## 2012-07-07 LAB — COMPREHENSIVE METABOLIC PANEL
ALT: 22 U/L (ref 0–35)
Albumin: 3.7 g/dL (ref 3.5–5.2)
Alkaline Phosphatase: 39 U/L (ref 39–117)
CO2: 27 mEq/L (ref 19–32)
Glucose, Bld: 74 mg/dL (ref 70–99)
Potassium: 3.8 mEq/L (ref 3.5–5.1)
Sodium: 140 mEq/L (ref 135–145)
Total Protein: 7.4 g/dL (ref 6.0–8.3)

## 2012-07-07 LAB — TSH: TSH: 1.1 u[IU]/mL (ref 0.35–5.50)

## 2012-07-07 LAB — CBC WITH DIFFERENTIAL/PLATELET
Eosinophils Relative: 2.4 % (ref 0.0–5.0)
Monocytes Absolute: 0.4 10*3/uL (ref 0.1–1.0)
Monocytes Relative: 9.3 % (ref 3.0–12.0)
Neutrophils Relative %: 61.5 % (ref 43.0–77.0)
Platelets: 203 10*3/uL (ref 150.0–400.0)
WBC: 3.8 10*3/uL — ABNORMAL LOW (ref 4.5–10.5)

## 2012-07-07 LAB — LIPID PANEL
Total CHOL/HDL Ratio: 3
VLDL: 25.4 mg/dL (ref 0.0–40.0)

## 2012-07-07 MED ORDER — NORGESTIM-ETH ESTRAD TRIPHASIC 0.18/0.215/0.25 MG-35 MCG PO TABS: 1.0000 | ORAL_TABLET | Freq: Every day | ORAL | Status: DC

## 2012-07-07 MED ORDER — SIMVASTATIN 20 MG PO TABS: 20.0000 mg | ORAL_TABLET | Freq: Every day | ORAL | Status: DC

## 2012-07-07 NOTE — Assessment & Plan Note (Signed)
Lab today on simvastatin and diet  Rev low sat fat diet Doing better with that and exercise

## 2012-07-07 NOTE — Assessment & Plan Note (Signed)
Reviewed health habits including diet and exercise and skin cancer prevention Also reviewed health mt list, fam hx and immunizations  Labs today

## 2012-07-07 NOTE — Assessment & Plan Note (Signed)
Exam with pap  Starting menses today- noted if endometrial cells show up on pap  No problems OC refilled

## 2012-07-07 NOTE — Patient Instructions (Signed)
Keep up the good work with weight loss/ diet/ exercise Pap today Labs today

## 2012-07-07 NOTE — Assessment & Plan Note (Signed)
Starting to loose wt with diet and exercise Commended  Discussed how this problem influences overall health and the risks it imposes  Reviewed plan for weight loss with lower calorie diet (via better food choices and also portion control or program like weight watchers) and exercise building up to or more than 30 minutes 5 days per week including some aerobic activity

## 2012-07-07 NOTE — Progress Notes (Signed)
Subjective:    Patient ID: Katherine Schwartz, female    DOB: November 28, 1965, 47 y.o.   MRN: [id number]  HPI Here for health maintenance exam and to review chronic medical problems    Hard time with allergies this season   Wt is down 3 lb with bmi of 34 Obese range Diet- better- fruit and veg and grains - doing better (trying to avoid carbs more) Exercise- fluctuates - zumba and walking   Flu vaccine-got one this season from [facility]   Pap 5/13 normal  No gyn problems  Periods are still regular for the most part  Remote hx of ascus pap  Td 2/07  Due for labs  Hyperlipidemia  Statin and diet Lab Results  Component Value Date   CHOL 208* 06/28/2011   HDL 71 06/28/2011   LDLCALC 120* 06/28/2011   LDLDIRECT 176.2 09/22/2008   TRIG 83 06/28/2011   CHOLHDL 2.9 06/28/2011    Patient Active Problem List   Diagnosis Date Noted  . Routine gynecological examination 06/28/2011  . Routine general medical examination at a health care facility 06/26/2010  . ASCUS PAP 08/10/2008  . OBESITY 08/03/2007  . HYPERLIPIDEMIA 05/01/2007  . ALLERGIC RHINITIS 05/01/2007  . 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 Allergy     allergic rhinitis  . Hyperlipidemia   . Acne   . Obesity    Past Surgical History  Procedure Laterality Date  . Cesarean section     History  Substance Use Topics  . Smoking status: Never Smoker   . Smokeless tobacco: Not on file  . Alcohol Use: Yes     Comment: occasional   Family History  Problem Relation Age of Onset  . Hypertension Mother   . Hyperlipidemia Mother   . Cancer Maternal Aunt     cervical CA  . Cancer Paternal Aunt     breast CA   Allergies  Allergen Reactions  . 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 cetirizine-pseudoephedrine (ZYRTEC-D ALLERGY & CONGESTION) 5-120 MG per tablet Take 1 tablet by mouth daily.         . simvastatin (ZOCOR) 20 MG tablet Take 1 tablet (20 mg total) by mouth at bedtime.  30 tablet  11  . TRI-SPRINTEC 0.18/0.215/0.25 MG-35 MCG tablet TAKE ONE TABLET BY MOUTH EVERY DAY AS DIRECTED  28 tablet  0   No current facility-administered medications on file prior to visit.     Review of Systems Review of Systems  Constitutional: Negative for fever, appetite change, fatigue and unexpected weight change.  Eyes: Negative for pain and visual disturbance.  Respiratory: Negative for cough and shortness of breath.   Cardiovascular: Negative for cp or palpitations    Gastrointestinal: Negative for nausea, diarrhea and constipation.  Genitourinary: Negative for urgency and frequency.  Skin: Negative for pallor or rash   Neurological: Negative for weakness, light-headedness, numbness and headaches.  Hematological: Negative for adenopathy. Does not bruise/bleed easily.  Psychiatric/Behavioral: Negative for dysphoric mood. The patient is not nervous/anxious.         Objective:   Physical Exam  Constitutional: She appears well-developed and well-nourished. No distress.  HENT:  Head: Normocephalic and atraumatic.  Right Ear: External ear normal.  Left Ear: External ear normal.  Nose: Nose normal.  Mouth/Throat: Oropharynx is clear and moist.  Eyes: Conjunctivae and  EOM are normal. Pupils are equal, round, and reactive to light. Right eye exhibits no discharge. Left eye exhibits no discharge. No scleral icterus.  Neck: Normal range of motion. Neck supple. No JVD present. Carotid bruit is not present. No thyromegaly present.  Cardiovascular: Normal rate, regular rhythm, normal heart sounds and intact distal pulses.  Exam reveals no gallop.   Pulmonary/Chest: Effort normal and breath sounds normal. No respiratory distress. She has no wheezes.  Abdominal: Soft. Bowel sounds are normal. She exhibits no distension, no abdominal bruit and no mass. There is no tenderness.  Genitourinary: Uterus  normal. No breast swelling, tenderness, discharge or bleeding. There is no rash, tenderness or lesion on the right labia. There is no rash, tenderness or lesion on the left labia. Uterus is not enlarged, not fixed and not tender. Cervix exhibits no motion tenderness, no discharge and no friability. Right adnexum displays no mass, no tenderness and no fullness. Left adnexum displays no mass, no tenderness and no fullness. There is bleeding around the vagina. No vaginal discharge found.  Starting menses- blood at [facility]   Breast exam: No mass, nodules, thickening, tenderness, bulging, retraction, inflamation, nipple discharge or skin changes noted.  No axillary or clavicular LA.  Chaperoned exam.    Musculoskeletal: She exhibits no edema and no tenderness.  Lymphadenopathy:    She has no cervical adenopathy.  Neurological: She is alert. She has normal reflexes. No cranial nerve deficit. She exhibits normal muscle tone. Coordination normal.  Skin: Skin is dry. No rash noted. No erythema. No pallor.  Skin tags diffusely neck and chest  Psychiatric: She has a normal mood and affect.          Assessment & Plan:

## 2012-07-08 ENCOUNTER — Encounter: Payer: Self-pay | Admitting: *Deleted

## 2012-07-28 ENCOUNTER — Encounter: Payer: Managed Care, Other (non HMO) | Admitting: Family Medicine

## 2012-08-14 ENCOUNTER — Encounter: Payer: Self-pay | Admitting: Family Medicine

## 2012-08-14 ENCOUNTER — Ambulatory Visit (INDEPENDENT_AMBULATORY_CARE_PROVIDER_SITE_OTHER): Payer: Managed Care, Other (non HMO) | Admitting: Family Medicine

## 2012-08-14 ENCOUNTER — Other Ambulatory Visit (HOSPITAL_COMMUNITY)
Admission: RE | Admit: 2012-08-14 | Discharge: 2012-08-14 | Disposition: A | Discharge status: Home / Self Care | Payer: Managed Care, Other (non HMO) | Source: Ambulatory Visit | Attending: Family Medicine | Admitting: Family Medicine

## 2012-08-14 VITALS — BP 112/64 | HR 89 | Temp 99.1°F | Ht 61.75 in | Wt 191.0 lb

## 2012-08-14 DIAGNOSIS — Z124 Encounter for screening for malignant neoplasm of cervix: Secondary | ICD-10-CM

## 2012-08-14 DIAGNOSIS — R87615 Unsatisfactory cytologic smear of cervix: Secondary | ICD-10-CM | POA: Insufficient documentation

## 2012-08-14 DIAGNOSIS — Z01419 Encounter for gynecological examination (general) (routine) without abnormal findings: Secondary | ICD-10-CM | POA: Insufficient documentation

## 2012-08-14 NOTE — Assessment & Plan Note (Signed)
Repeat pap due to insuff cervical cells last time (had light menses also)- and remote hx of ascus Though neg hpv with last pap  Will update with result

## 2012-08-14 NOTE — Progress Notes (Signed)
Subjective:    Patient ID: Katherine Schwartz, female    DOB: 05-18-1965, 47 y.o.   MRN: [id number]  HPI Here to repeat pap smear due to insuff cervical cells last time (was also having light menses) Remote hx of abn pap   No symptoms No vag pain or itching   Occ her menses is irregular    Patient Active Problem List   Diagnosis Date Noted  . Encounter for repeat Pap smear due to previous insufficient cervical cells 08/14/2012  . Routine gynecological examination 06/28/2011  . Routine general medical examination at a health care facility 06/26/2010  . ASCUS PAP 08/10/2008  . OBESITY 08/03/2007  . HYPERLIPIDEMIA 05/01/2007  . ALLERGIC RHINITIS 05/01/2007  . 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 Allergy     allergic rhinitis  . Hyperlipidemia   . Acne   . Obesity    Past Surgical History  Procedure Laterality Date  . Cesarean section     History  Substance Use Topics  . Smoking status: Never Smoker   . Smokeless tobacco: Not on file  . Alcohol Use: Yes     Comment: occasional   Family History  Problem Relation Age of Onset  . Hypertension Mother   . Hyperlipidemia Mother   . Cancer Maternal Aunt     cervical CA  . Cancer Paternal Aunt     breast CA   Allergies  Allergen Reactions  . 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 cetirizine-pseudoephedrine (ZYRTEC-D ALLERGY & CONGESTION) 5-120 MG per tablet Take 1 tablet by mouth daily.        . Dapsone (ACZONE) 5 % topical gel Apply 1 application topically 2 (two) times daily.      . Norgestimate-Ethinyl Estradiol Triphasic (TRI-SPRINTEC) 0.18/0.215/0.25 MG-35 MCG tablet Take 1 tablet by mouth daily.  28 tablet  11  . simvastatin (ZOCOR) 20 MG tablet Take 1 tablet (20 mg total) by mouth at bedtime.  30 tablet  11  . triamcinolone cream (KENALOG) 0.1 % Apply topically as needed.        No current facility-administered medications on file prior to visit.    Review of Systems Review of Systems  Constitutional: Negative for fever, appetite change, fatigue and unexpected weight change.  Eyes: Negative for pain and visual disturbance.  Respiratory: Negative for cough and shortness of breath.   Cardiovascular: Negative for cp or palpitations    Gastrointestinal: Negative for nausea, diarrhea and constipation.  Genitourinary: Negative for urgency and frequency. neg for vag bleeding/ d/c / pain or pelvic pain  Skin: Negative for pallor or rash   Neurological: Negative for weakness, light-headedness, numbness and headaches.  Hematological: Negative for adenopathy. Does not bruise/bleed easily.  Psychiatric/Behavioral: Negative for dysphoric mood. The patient is not nervous/anxious.         Objective:   Physical Exam  Constitutional: She appears well-developed and well-nourished. No distress.  obese and well appearing   HENT:  Head: Normocephalic and atraumatic.  Abdominal: Soft. Bowel sounds are normal.  No suprapubic tenderness or fullness  Genitourinary: Vagina normal. There is no rash, tenderness or lesion on the right labia. There is no rash, tenderness or lesion on the left labia. Cervix exhibits no motion tenderness, no discharge and no friability. No bleeding around the vagina. No vaginal discharge found.  Neurological: She  is alert.  Skin: Skin is warm and dry.  Psychiatric: She has a normal mood and affect.          Assessment & Plan:

## 2012-08-14 NOTE — Patient Instructions (Addendum)
Repeat pap today  Will let you know when it returns

## 2012-08-19 ENCOUNTER — Encounter: Payer: Self-pay | Admitting: *Deleted

## 2013-03-04 ENCOUNTER — Other Ambulatory Visit: Payer: Self-pay

## 2013-03-04 DIAGNOSIS — Z1231 Encounter for screening mammogram for malignant neoplasm of breast: Secondary | ICD-10-CM

## 2013-04-07 ENCOUNTER — Ambulatory Visit: Payer: Managed Care, Other (non HMO)

## 2013-04-29 ENCOUNTER — Ambulatory Visit
Admission: RE | Admit: 2013-04-29 | Discharge: 2013-04-29 | Disposition: A | Discharge status: Home / Self Care | Payer: Managed Care, Other (non HMO) | Source: Ambulatory Visit

## 2013-04-29 DIAGNOSIS — Z1231 Encounter for screening mammogram for malignant neoplasm of breast: Secondary | ICD-10-CM

## 2013-05-06 ENCOUNTER — Encounter: Payer: Self-pay | Admitting: *Deleted

## 2013-06-08 ENCOUNTER — Other Ambulatory Visit: Payer: Self-pay | Admitting: Family Medicine

## 2013-07-09 ENCOUNTER — Ambulatory Visit (INDEPENDENT_AMBULATORY_CARE_PROVIDER_SITE_OTHER): Payer: Managed Care, Other (non HMO) | Admitting: Family Medicine

## 2013-07-09 ENCOUNTER — Encounter (INDEPENDENT_AMBULATORY_CARE_PROVIDER_SITE_OTHER): Payer: Self-pay

## 2013-07-09 ENCOUNTER — Encounter: Payer: Self-pay | Admitting: Family Medicine

## 2013-07-09 VITALS — BP 122/78 | HR 80 | Temp 98.8°F | Ht 62.0 in | Wt 184.5 lb

## 2013-07-09 DIAGNOSIS — E785 Hyperlipidemia, unspecified: Secondary | ICD-10-CM

## 2013-07-09 DIAGNOSIS — E669 Obesity, unspecified: Secondary | ICD-10-CM

## 2013-07-09 DIAGNOSIS — Z0000 Encounter for general adult medical examination without abnormal findings: Secondary | ICD-10-CM

## 2013-07-09 LAB — LIPID PANEL
CHOL/HDL RATIO: 4
Cholesterol: 232 mg/dL — ABNORMAL HIGH (ref 0–200)
HDL: 56.2 mg/dL (ref >39.00)
LDL CALC: 149 mg/dL — AB (ref 0–99)
NonHDL: 175.8
TRIGLYCERIDES: 132 mg/dL (ref 0.0–149.0)
VLDL: 26.4 mg/dL (ref 0.0–40.0)

## 2013-07-09 LAB — CBC WITH DIFFERENTIAL/PLATELET
BASOS ABS: 0 10*3/uL (ref 0.0–0.1)
Basophils Relative: 0.8 % (ref 0.0–3.0)
EOS ABS: 0.1 10*3/uL (ref 0.0–0.7)
Eosinophils Relative: 2.5 % (ref 0.0–5.0)
HEMATOCRIT: 39.5 % (ref 36.0–46.0)
HEMOGLOBIN: 13.4 g/dL (ref 12.0–15.0)
LYMPHS ABS: 1.2 10*3/uL (ref 0.7–4.0)
Lymphocytes Relative: 33.7 % (ref 12.0–46.0)
MCHC: 33.9 g/dL (ref 30.0–36.0)
MCV: 96.2 fl (ref 78.0–100.0)
MONO ABS: 0.4 10*3/uL (ref 0.1–1.0)
Monocytes Relative: 10.7 % (ref 3.0–12.0)
NEUTROS ABS: 1.9 10*3/uL (ref 1.4–7.7)
Neutrophils Relative %: 52.3 % (ref 43.0–77.0)
PLATELETS: 238 10*3/uL (ref 150.0–400.0)
RBC: 4.1 Mil/uL (ref 3.87–5.11)
RDW: 12.7 % (ref 11.5–15.5)
WBC: 3.6 10*3/uL — ABNORMAL LOW (ref 4.0–10.5)

## 2013-07-09 LAB — COMPREHENSIVE METABOLIC PANEL
ALK PHOS: 42 U/L (ref 39–117)
ALT: 24 U/L (ref 0–35)
AST: 23 U/L (ref 0–37)
Albumin: 3.7 g/dL (ref 3.5–5.2)
BILIRUBIN TOTAL: 0.6 mg/dL (ref 0.2–1.2)
BUN: 8 mg/dL (ref 6–23)
CO2: 27 mEq/L (ref 19–32)
CREATININE: 0.6 mg/dL (ref 0.4–1.2)
Calcium: 9.6 mg/dL (ref 8.4–10.5)
Chloride: 104 mEq/L (ref 96–112)
GFR: 131.18 mL/min (ref >60.00)
Glucose, Bld: 78 mg/dL (ref 70–99)
Potassium: 4.2 mEq/L (ref 3.5–5.1)
Sodium: 139 mEq/L (ref 135–145)
Total Protein: 7.3 g/dL (ref 6.0–8.3)

## 2013-07-09 LAB — TSH: TSH: 0.99 u[IU]/mL (ref 0.35–4.50)

## 2013-07-09 MED ORDER — NORGESTIM-ETH ESTRAD TRIPHASIC 0.18/0.215/0.25 MG-35 MCG PO TABS: ORAL_TABLET | ORAL | Status: DC

## 2013-07-09 MED ORDER — SIMVASTATIN 20 MG PO TABS: 20.0000 mg | ORAL_TABLET | Freq: Every day | ORAL | Status: DC

## 2013-07-09 NOTE — Assessment & Plan Note (Signed)
Reviewed health habits including diet and exercise and skin cancer prevention Reviewed appropriate screening tests for age  Also reviewed health mt list, fam hx and immunization status , as well as social and family history    Form filled out for work See HPI Commended on wt loss Lab today  Will f/u for gyn exam when menses is over

## 2013-07-09 NOTE — Progress Notes (Signed)
Subjective:    Patient ID: Katherine Schwartz, female    DOB: 1965/07/11, 48 y.o.   MRN: [id number]  HPI Here for health maintenance exam and to review chronic medical problems    Doing pretty well - but a lot of allergies  On zyrtec D  netti pot also  Has not tried flonase    Wt is down 7 lb with bmi of 33 She began a program with [employer] - vegan type program in January - and initially lost 10-15 lb  [employer]ined [employer]  zumba and yoga also  Felt better eating that way   Flu vaccine - got it at [facility] in the fall   Mammogram nl 4/15-nl  Self exam -no lumps   Td 2/07  Pap 7/14 nl  Remote hx of ascus  Menses today  Wants to do her labs today-fasting   Signed a form for work stating she had a physical   Patient Active Problem List   Diagnosis Date Noted  . Encounter for repeat Pap smear due to previous insufficient cervical cells 08/14/2012  . Routine gynecological examination 06/28/2011  . Routine general medical examination at a health care facility 06/26/2010  . ASCUS PAP 08/10/2008  . OBESITY 08/03/2007  . HYPERLIPIDEMIA 05/01/2007  . ALLERGIC RHINITIS 05/01/2007  . 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 Allergy     allergic rhinitis  . Hyperlipidemia   . Acne   . Obesity    Past Surgical History  Procedure Laterality Date  . Cesarean section     History  Substance Use Topics  . Smoking status: Never Smoker   . Smokeless tobacco: Not on file  . Alcohol Use: Yes     Comment: occasional   Family History  Problem Relation Age of Onset  . Hypertension Mother   . Hyperlipidemia Mother   . Cancer Maternal Aunt     cervical CA  . Cancer Paternal Aunt     breast CA   Allergies  Allergen Reactions  . 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 cetirizine-pseudoephedrine (ZYRTEC-D ALLERGY & CONGESTION) 5-120 MG per tablet Take  1 tablet by mouth daily.        . simvastatin (ZOCOR) 20 MG tablet Take 1 tablet (20 mg total) by mouth at bedtime.  30 tablet  11  . TRI-SPRINTEC 0.18/0.215/0.25 MG-35 MCG tablet TAKE ONE TABLET BY MOUTH ONE TIME DAILY   28 tablet  0   No current facility-administered medications on file prior to visit.    Review of Systems Review of Systems  Constitutional: Negative for fever, appetite change, fatigue and unexpected weight change.  Eyes: Negative for pain and visual disturbance.  Respiratory: Negative for cough and shortness of breath.   Cardiovascular: Negative for cp or palpitations    Gastrointestinal: Negative for nausea, diarrhea and constipation.  Genitourinary: Negative for urgency and frequency.  Skin: Negative for pallor or rash   Neurological: Negative for weakness, light-headedness, numbness and headaches.  Hematological: Negative for adenopathy. Does not bruise/bleed easily.  Psychiatric/Behavioral: Negative for dysphoric mood. The patient is not nervous/anxious.         Objective:   Physical Exam  Constitutional: She appears well-developed and well-nourished. No distress.  obese and well appearing   HENT:  Head: Normocephalic and atraumatic.  Right Ear: External ear  normal.  Left Ear: External ear normal.  Nose: Nose normal.  Mouth/Throat: Oropharynx is clear and moist.  Eyes: Conjunctivae and EOM are normal. Pupils are equal, round, and reactive to light. Right eye exhibits no discharge. Left eye exhibits no discharge. No scleral icterus.  Neck: Normal range of motion. Neck supple. No JVD present. No thyromegaly present.  Cardiovascular: Normal rate, regular rhythm, normal heart sounds and intact distal pulses.  Exam reveals no gallop.   Pulmonary/Chest: Effort normal and breath sounds normal. No respiratory distress. She has no wheezes. She has no rales.  Abdominal: Soft. Bowel sounds are normal. She exhibits no distension and no mass. There is no tenderness.    Musculoskeletal: She exhibits no edema and no tenderness.  Lymphadenopathy:    She has no cervical adenopathy.  Neurological: She is alert. She has normal reflexes. No cranial nerve deficit. She exhibits normal muscle tone. Coordination normal.  Skin: Skin is warm and dry. No rash noted. No erythema. No pallor.  Facial acne  Psychiatric: She has a normal mood and affect.          Assessment & Plan:   Problem List Items Addressed This Visit     Other   HYPERLIPIDEMIA - Primary     Lipid panel today Disc goals On statin  Diet is improved  Disc goals for chol numbers    Relevant Medications      simvastatin (ZOCOR) tablet   Other Relevant Orders      Lipid panel (Completed)   OBESITY     Discussed how this problem influences overall health and the risks it imposes  Reviewed plan for weight loss with lower calorie diet (via better food choices and also portion control or program like weight watchers) and exercise building up to or more than 30 minutes 5 days per week including some aerobic activity    Commended on hard work so far!    Routine general medical examination at a health care facility     Reviewed health habits including diet and exercise and skin cancer prevention Reviewed appropriate screening tests for age  Also reviewed health mt list, fam hx and immunization status , as well as social and family history    Form filled out for work See HPI Commended on wt loss Lab today  Will f/u for gyn exam when menses is over     Relevant Orders      CBC with Differential (Completed)      Comprehensive metabolic panel (Completed)      TSH (Completed)      Lipid panel (Completed)

## 2013-07-09 NOTE — Assessment & Plan Note (Signed)
Lipid panel today Disc goals On statin  Diet is improved  Disc goals for chol numbers

## 2013-07-09 NOTE — Patient Instructions (Signed)
Labs today Schedule an appt. When able for gyn exam - 15 min  Keep up the good work with diet and exercise  No change in medicines  Try flonase or nasacort otc in addition to your zyrtec

## 2013-07-09 NOTE — Progress Notes (Signed)
Pre visit review using our clinic review tool, if applicable. No additional management support is needed unless otherwise documented below in the visit note. 

## 2013-07-09 NOTE — Assessment & Plan Note (Signed)
Discussed how this problem influences overall health and the risks it imposes  Reviewed plan for weight loss with lower calorie diet (via better food choices and also portion control or program like weight watchers) and exercise building up to or more than 30 minutes 5 days per week including some aerobic activity   Commended on hard work so far  

## 2013-07-27 ENCOUNTER — Ambulatory Visit (INDEPENDENT_AMBULATORY_CARE_PROVIDER_SITE_OTHER): Payer: Managed Care, Other (non HMO) | Admitting: Family Medicine

## 2013-07-27 ENCOUNTER — Other Ambulatory Visit (HOSPITAL_COMMUNITY)
Admission: RE | Admit: 2013-07-27 | Discharge: 2013-07-27 | Disposition: A | Discharge status: Home / Self Care | Payer: Managed Care, Other (non HMO) | Source: Ambulatory Visit | Attending: Family Medicine | Admitting: Family Medicine

## 2013-07-27 ENCOUNTER — Encounter: Payer: Self-pay | Admitting: Family Medicine

## 2013-07-27 VITALS — BP 128/82 | HR 79 | Temp 98.3°F | Ht 62.0 in | Wt 185.2 lb

## 2013-07-27 DIAGNOSIS — E785 Hyperlipidemia, unspecified: Secondary | ICD-10-CM

## 2013-07-27 DIAGNOSIS — Z01419 Encounter for gynecological examination (general) (routine) without abnormal findings: Secondary | ICD-10-CM | POA: Insufficient documentation

## 2013-07-27 NOTE — Progress Notes (Signed)
Pre visit review using our clinic review tool, if applicable. No additional management support is needed unless otherwise documented below in the visit note. 

## 2013-07-27 NOTE — Progress Notes (Signed)
Subjective:    Patient ID: Katherine Schwartz, female    DOB: January 17, 1966, 48 y.o.   MRN: [id number]  HPI Here for gyn exam Menses lasts 3-4 days  About the same  Overall regular   ? If hot flashes -unsure  She tends to be hot natured   Pap done   Office Visit on 07/09/2013  Component Date Value Ref Range Status  . WBC 07/09/2013 3.6* 4.0 - 10.5 K/uL Final  . RBC 07/09/2013 4.10  3.87 - 5.11 Mil/uL Final  . Hemoglobin 07/09/2013 13.4  12.0 - 15.0 g/dL Final  . HCT 07/09/2013 39.5  36.0 - 46.0 % Final  . MCV 07/09/2013 96.2  78.0 - 100.0 fl Final  . MCHC 07/09/2013 33.9  30.0 - 36.0 g/dL Final  . RDW 07/09/2013 12.7  11.5 - 15.5 % Final  . Platelets 07/09/2013 238.0  150.0 - 400.0 K/uL Final  . Neutrophils Relative % 07/09/2013 52.3  43.0 - 77.0 % Final  . Lymphocytes Relative 07/09/2013 33.7  12.0 - 46.0 % Final  . Monocytes Relative 07/09/2013 10.7  3.0 - 12.0 % Final  . Eosinophils Relative 07/09/2013 2.5  0.0 - 5.0 % Final  . Basophils Relative 07/09/2013 0.8  0.0 - 3.0 % Final  . Neutro Abs 07/09/2013 1.9  1.4 - 7.7 K/uL Final  . Lymphs Abs 07/09/2013 1.2  0.7 - 4.0 K/uL Final  . Monocytes Absolute 07/09/2013 0.4  0.1 - 1.0 K/uL Final  . Eosinophils Absolute 07/09/2013 0.1  0.0 - 0.7 K/uL Final  . Basophils Absolute 07/09/2013 0.0  0.0 - 0.1 K/uL Final  . Sodium 07/09/2013 139  135 - 145 mEq/L Final  . Potassium 07/09/2013 4.2  3.5 - 5.1 mEq/L Final  . Chloride 07/09/2013 104  96 - 112 mEq/L Final  . CO2 07/09/2013 27  19 - 32 mEq/L Final  . Glucose, Bld 07/09/2013 78  70 - 99 mg/dL Final  . BUN 07/09/2013 8  6 - 23 mg/dL Final  . Creatinine, Ser 07/09/2013 0.6  0.4 - 1.2 mg/dL Final  . Total Bilirubin 07/09/2013 0.6  0.2 - 1.2 mg/dL Final  . Alkaline Phosphatase 07/09/2013 42  39 - 117 U/L Final  . AST 07/09/2013 23  0 - 37 U/L Final  . ALT 07/09/2013 24  0 - 35 U/L Final  . Total Protein 07/09/2013 7.3  6.0 - 8.3 g/dL Final  . Albumin 07/09/2013 3.7  3.5 - 5.2 g/dL  Final  . Calcium 07/09/2013 9.6  8.4 - 10.5 mg/dL Final  . GFR 07/09/2013 131.18  >60.00 mL/min Final  . TSH 07/09/2013 0.99  0.35 - 4.50 uIU/mL Final  . Cholesterol 07/09/2013 232* 0 - 200 mg/dL Final   ATP III Classification       Desirable:  < 200 mg/dL               Borderline High:  200 - 239 mg/dL          High:  > = 240 mg/dL  . Triglycerides 07/09/2013 132.0  0.0 - 149.0 mg/dL Final   Normal:  <150 mg/dLBorderline High:  150 - 199 mg/dL  . HDL 07/09/2013 56.20  >39.00 mg/dL Final  . VLDL 07/09/2013 26.4  0.0 - 40.0 mg/dL Final  . LDL Cholesterol 07/09/2013 149* 0 - 99 mg/dL Final  . Total CHOL/HDL Ratio 07/09/2013 4   Final  Men          Women1/2 Average Risk     3.4          3.3Average Risk          5.0          4.42X Average Risk          9.6          7.13X Average Risk          15.0          11.0                      . NonHDL 07/09/2013 175.80   Final   on simvastatin - not always consistent with the pill  Will get back on track  Will change zocor to am for better control   Patient Active Problem List   Diagnosis Date Noted  . Encounter for repeat Pap smear due to previous insufficient cervical cells 08/14/2012  . Routine gynecological examination 06/28/2011  . Routine general medical examination at a health care facility 06/26/2010  . OBESITY 08/03/2007  . HYPERLIPIDEMIA 05/01/2007  . ALLERGIC RHINITIS 05/01/2007  . 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 Allergy     allergic rhinitis  . Hyperlipidemia   . Acne   . Obesity    Past Surgical History  Procedure Laterality Date  . Cesarean section     History  Substance Use Topics  . Smoking status: Never Smoker   . Smokeless tobacco: Not on file  . Alcohol Use: Yes     Comment: occasional   Family History  Problem Relation Age of Onset  . Hypertension Mother   . Hyperlipidemia Mother   . Cancer Maternal Aunt     cervical CA  . Cancer Paternal Aunt     breast CA     Allergies  Allergen Reactions  . 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 cetirizine-pseudoephedrine (ZYRTEC-D ALLERGY & CONGESTION) 5-120 MG per tablet Take 1 tablet by mouth daily.        . Dapsone (ACZONE) 5 % topical gel Apply 1 application topically 2 (two) times daily.      . Norgestimate-Ethinyl Estradiol Triphasic (TRI-SPRINTEC) 0.18/0.215/0.25 MG-35 MCG tablet TAKE ONE TABLET BY MOUTH ONE TIME DAILY  28 tablet  11  . simvastatin (ZOCOR) 20 MG tablet Take 1 tablet (20 mg total) by mouth at bedtime.  30 tablet  11   No current facility-administered medications on file prior to visit.      Review of Systems Review of Systems  Constitutional: Negative for fever, appetite change, fatigue and unexpected weight change.  Eyes: Negative for pain and visual disturbance.  Respiratory: Negative for cough and shortness of breath.   Cardiovascular: Negative for cp or palpitations    Gastrointestinal: Negative for nausea, diarrhea and constipation.  Genitourinary: Negative for urgency and frequency. neg for vaginal d/c or pelvic pain or menst problems  Skin: Negative for pallor or rash   Neurological: Negative for weakness, light-headedness, numbness and headaches.  Hematological: Negative for adenopathy. Does not bruise/bleed easily.  Psychiatric/Behavioral: Negative for dysphoric mood. The patient is not nervous/anxious.         Objective:   Physical Exam  Constitutional: She appears well-developed and well-nourished.  HENT:  Head: Normocephalic and  atraumatic.  Cardiovascular: Normal rate and regular rhythm.   Pulmonary/Chest: Effort normal and breath sounds normal.  Genitourinary: Vagina normal and uterus normal. No breast swelling, tenderness, discharge or bleeding. There is no rash, tenderness or lesion on the right labia. There is no rash, tenderness or lesion on the left  labia. Uterus is not enlarged and not tender. Cervix exhibits no motion tenderness, no discharge and no friability. Right adnexum displays no mass, no tenderness and no fullness. Left adnexum displays no mass, no tenderness and no fullness. No bleeding around the vagina. No vaginal discharge found.  Breast exam: No mass, nodules, thickening, tenderness, bulging, retraction, inflamation, nipple discharge or skin changes noted.  No axillary or clavicular LA.      Neurological: She is alert.  Skin: Skin is warm and dry. No rash noted.  Psychiatric: She has a normal mood and affect.          Assessment & Plan:   Problem List Items Addressed This Visit     Other   HYPERLIPIDEMIA     Disc goals for lipids and reasons to control them Rev labs with pt Rev low sat fat diet in detail    Relevant Orders      Lipid panel   Routine gynecological examination - Primary     Routine exam and pap done No concerns     Relevant Orders      Cytology - PAP (Completed)

## 2013-07-27 NOTE — Patient Instructions (Signed)
Take care of yourself  Take your cholesterol medicine more regularly Avoid red meat/ fried foods/ egg yolks/ fatty breakfast meats/ butter, cheese and high fat dairy/ and shellfish   Schedule lab appt. For 3 months

## 2013-07-28 LAB — CYTOLOGY - PAP

## 2013-07-29 ENCOUNTER — Other Ambulatory Visit: Payer: Self-pay

## 2013-07-29 DIAGNOSIS — B3731 Acute candidiasis of vulva and vagina: Secondary | ICD-10-CM

## 2013-07-29 DIAGNOSIS — B373 Candidiasis of vulva and vagina: Secondary | ICD-10-CM

## 2013-07-29 MED ORDER — FLUCONAZOLE 150 MG PO TABS: 150.0000 mg | ORAL_TABLET | Freq: Once | ORAL | Status: DC

## 2013-07-29 NOTE — Assessment & Plan Note (Signed)
Disc goals for lipids and reasons to control them Rev labs with pt Rev low sat fat diet in detail   

## 2013-07-29 NOTE — Assessment & Plan Note (Signed)
Routine exam and pap done No concerns

## 2013-08-02 ENCOUNTER — Ambulatory Visit: Payer: Managed Care, Other (non HMO) | Admitting: Family Medicine

## 2013-10-27 ENCOUNTER — Other Ambulatory Visit (INDEPENDENT_AMBULATORY_CARE_PROVIDER_SITE_OTHER): Payer: 59

## 2013-10-27 DIAGNOSIS — E785 Hyperlipidemia, unspecified: Secondary | ICD-10-CM

## 2013-10-27 LAB — LIPID PANEL
CHOL/HDL RATIO: 4
Cholesterol: 202 mg/dL — ABNORMAL HIGH (ref 0–200)
HDL: 54.4 mg/dL (ref >39.00)
LDL CALC: 116 mg/dL — AB (ref 0–99)
NonHDL: 147.6
TRIGLYCERIDES: 160 mg/dL — AB (ref 0.0–149.0)
VLDL: 32 mg/dL (ref 0.0–40.0)

## 2013-11-01 ENCOUNTER — Encounter: Payer: Self-pay | Admitting: *Deleted

## 2014-03-30 ENCOUNTER — Other Ambulatory Visit: Payer: Self-pay

## 2014-03-30 DIAGNOSIS — Z1231 Encounter for screening mammogram for malignant neoplasm of breast: Secondary | ICD-10-CM

## 2014-05-02 ENCOUNTER — Ambulatory Visit
Admission: RE | Admit: 2014-05-02 | Discharge: 2014-05-02 | Disposition: A | Discharge status: Home / Self Care | Payer: 59 | Source: Ambulatory Visit

## 2014-05-02 DIAGNOSIS — Z1231 Encounter for screening mammogram for malignant neoplasm of breast: Secondary | ICD-10-CM

## 2014-05-03 ENCOUNTER — Telehealth: Payer: Self-pay

## 2014-05-03 NOTE — Telephone Encounter (Signed)
Informed patient of normal mammogram.

## 2014-05-03 NOTE — Telephone Encounter (Signed)
-----   Message from Abner Greenspan, MD sent at 05/02/2014  8:49 PM EDT ----- Mammogram is normal  Please note for flow sheet if you can  Due for next screening mammogram in 1 year

## 2014-05-30 ENCOUNTER — Other Ambulatory Visit: Payer: Self-pay | Admitting: Family Medicine

## 2014-05-31 ENCOUNTER — Other Ambulatory Visit: Payer: Managed Care, Other (non HMO)

## 2014-06-01 ENCOUNTER — Telehealth: Payer: Self-pay | Admitting: Family Medicine

## 2014-06-01 ENCOUNTER — Other Ambulatory Visit (INDEPENDENT_AMBULATORY_CARE_PROVIDER_SITE_OTHER): Payer: 59

## 2014-06-01 DIAGNOSIS — Z0000 Encounter for general adult medical examination without abnormal findings: Secondary | ICD-10-CM | POA: Diagnosis not present

## 2014-06-01 LAB — TSH: TSH: 1.93 u[IU]/mL (ref 0.35–4.50)

## 2014-06-01 LAB — CBC WITH DIFFERENTIAL/PLATELET
BASOS PCT: 0.7 % (ref 0.0–3.0)
Basophils Absolute: 0 10*3/uL (ref 0.0–0.1)
EOS ABS: 0.1 10*3/uL (ref 0.0–0.7)
EOS PCT: 3.5 % (ref 0.0–5.0)
HEMATOCRIT: 39 % (ref 36.0–46.0)
HEMOGLOBIN: 13.3 g/dL (ref 12.0–15.0)
LYMPHS PCT: 31.2 % (ref 12.0–46.0)
Lymphs Abs: 1.2 10*3/uL (ref 0.7–4.0)
MCHC: 34.2 g/dL (ref 30.0–36.0)
MCV: 95.3 fl (ref 78.0–100.0)
MONOS PCT: 9.3 % (ref 3.0–12.0)
Monocytes Absolute: 0.3 10*3/uL (ref 0.1–1.0)
Neutro Abs: 2.1 10*3/uL (ref 1.4–7.7)
Neutrophils Relative %: 55.3 % (ref 43.0–77.0)
Platelets: 212 10*3/uL (ref 150.0–400.0)
RBC: 4.09 Mil/uL (ref 3.87–5.11)
RDW: 13.1 % (ref 11.5–15.5)
WBC: 3.7 10*3/uL — AB (ref 4.0–10.5)

## 2014-06-01 LAB — LIPID PANEL
CHOLESTEROL: 191 mg/dL (ref 0–200)
HDL: 59.4 mg/dL (ref >39.00)
LDL CALC: 107 mg/dL — AB (ref 0–99)
NonHDL: 131.6
Total CHOL/HDL Ratio: 3
Triglycerides: 122 mg/dL (ref 0.0–149.0)
VLDL: 24.4 mg/dL (ref 0.0–40.0)

## 2014-06-01 LAB — COMPREHENSIVE METABOLIC PANEL
ALK PHOS: 48 U/L (ref 39–117)
ALT: 19 U/L (ref 0–35)
AST: 19 U/L (ref 0–37)
Albumin: 4 g/dL (ref 3.5–5.2)
BILIRUBIN TOTAL: 0.6 mg/dL (ref 0.2–1.2)
BUN: 9 mg/dL (ref 6–23)
CO2: 28 mEq/L (ref 19–32)
CREATININE: 0.64 mg/dL (ref 0.40–1.20)
Calcium: 9.6 mg/dL (ref 8.4–10.5)
Chloride: 106 mEq/L (ref 96–112)
GFR: 126 mL/min (ref >60.00)
Glucose, Bld: 77 mg/dL (ref 70–99)
Potassium: 4.5 mEq/L (ref 3.5–5.1)
Sodium: 139 mEq/L (ref 135–145)
TOTAL PROTEIN: 6.9 g/dL (ref 6.0–8.3)

## 2014-06-01 NOTE — Telephone Encounter (Signed)
PE orders

## 2014-06-07 ENCOUNTER — Encounter: Payer: Self-pay | Admitting: Family Medicine

## 2014-06-07 ENCOUNTER — Ambulatory Visit (INDEPENDENT_AMBULATORY_CARE_PROVIDER_SITE_OTHER): Payer: 59 | Admitting: Family Medicine

## 2014-06-07 ENCOUNTER — Other Ambulatory Visit (HOSPITAL_COMMUNITY)
Admission: RE | Admit: 2014-06-07 | Discharge: 2014-06-07 | Disposition: A | Discharge status: Home / Self Care | Payer: 59 | Source: Ambulatory Visit | Attending: Family Medicine | Admitting: Family Medicine

## 2014-06-07 VITALS — BP 118/76 | HR 95 | Temp 98.5°F | Ht 62.0 in | Wt 190.8 lb

## 2014-06-07 DIAGNOSIS — Z114 Encounter for screening for human immunodeficiency virus [HIV]: Secondary | ICD-10-CM

## 2014-06-07 DIAGNOSIS — Z0000 Encounter for general adult medical examination without abnormal findings: Secondary | ICD-10-CM

## 2014-06-07 DIAGNOSIS — E785 Hyperlipidemia, unspecified: Secondary | ICD-10-CM | POA: Diagnosis not present

## 2014-06-07 DIAGNOSIS — E669 Obesity, unspecified: Secondary | ICD-10-CM

## 2014-06-07 DIAGNOSIS — Z01419 Encounter for gynecological examination (general) (routine) without abnormal findings: Secondary | ICD-10-CM

## 2014-06-07 DIAGNOSIS — Z1211 Encounter for screening for malignant neoplasm of colon: Secondary | ICD-10-CM

## 2014-06-07 DIAGNOSIS — Z1159 Encounter for screening for other viral diseases: Secondary | ICD-10-CM

## 2014-06-07 MED ORDER — SIMVASTATIN 20 MG PO TABS: 20.0000 mg | ORAL_TABLET | Freq: Every day | ORAL | Status: DC

## 2014-06-07 MED ORDER — NORGESTIM-ETH ESTRAD TRIPHASIC 0.18/0.215/0.25 MG-35 MCG PO TABS: 1.0000 | ORAL_TABLET | Freq: Every day | ORAL | Status: DC

## 2014-06-07 NOTE — Assessment & Plan Note (Signed)
Pt would like screening  Hep C screen today

## 2014-06-07 NOTE — Assessment & Plan Note (Signed)
Pt desires HIV screen  Check today

## 2014-06-07 NOTE — Patient Instructions (Signed)
Lab today for Hep C and HIV screening  Stop at check out for referral for colonoscopy  Pap today Labs are stable

## 2014-06-07 NOTE — Progress Notes (Signed)
Subjective:    Patient ID: Katherine Schwartz, female    DOB: 1965-04-08, 49 y.o.   MRN: [id number]  HPI  Here for health maintenance exam and to review chronic medical problems    A very busy year  Daughter just grad from [employer] Mother died this year - grief comes and goes , thinks she is doing ok overall  Not a lot of time to take care of herself   Job is transitioning as well - will be working from home / works long hours   [employer] is up 5 lb with bmi of 34  Hep C/HIV screen- would like to be screened   Colon cancer screening - is 48 this year - would like to get a colonoscopy    Flu shot - did get it at [employer] this season   Td 2/07  Mammogram 4/16 nl Self exam- no lumps   Pap nl 6/15  No gyn symptoms  Periods are regular  On OC  No hot flashes-it is hard to tell  Had one new partner this past year   Hyperlipidemia Lab Results  Component Value Date   CHOL 191 06/01/2014   CHOL 202* 10/27/2013   CHOL 232* 07/09/2013   Lab Results  Component Value Date   HDL 59.40 06/01/2014   HDL 54.40 10/27/2013   HDL 56.20 07/09/2013   Lab Results  Component Value Date   LDLCALC 107* 06/01/2014   LDLCALC 116* 10/27/2013   LDLCALC 149* 07/09/2013   Lab Results  Component Value Date   TRIG 122.0 06/01/2014   TRIG 160.0* 10/27/2013   TRIG 132.0 07/09/2013   Lab Results  Component Value Date   CHOLHDL 3 06/01/2014   CHOLHDL 4 10/27/2013   CHOLHDL 4 07/09/2013   Lab Results  Component Value Date   LDLDIRECT 176.2 09/22/2008   LDLDIRECT 141.6 06/17/2008   LDLDIRECT 131.2 12/08/2007   she is trying to eat "clean" - is a challenge at times but working on it  Seldom eats fried foods  Still taking simvastatin   Results for orders placed or performed in visit on 06/01/14  CBC with Differential/Platelet  Result Value Ref Range   WBC 3.7 (L) 4.0 - 10.5 K/uL   RBC 4.09 3.87 - 5.11 Mil/uL   Hemoglobin 13.3 12.0 - 15.0 g/dL   HCT 39.0 36.0 - 46.0 %   MCV 95.3 78.0 - 100.0  fl   MCHC 34.2 30.0 - 36.0 g/dL   RDW 13.1 11.5 - 15.5 %   Platelets 212.0 150.0 - 400.0 K/uL   Neutrophils Relative % 55.3 43.0 - 77.0 %   Lymphocytes Relative 31.2 12.0 - 46.0 %   Monocytes Relative 9.3 3.0 - 12.0 %   Eosinophils Relative 3.5 0.0 - 5.0 %   Basophils Relative 0.7 0.0 - 3.0 %   Neutro Abs 2.1 1.4 - 7.7 K/uL   Lymphs Abs 1.2 0.7 - 4.0 K/uL   Monocytes Absolute 0.3 0.1 - 1.0 K/uL   Eosinophils Absolute 0.1 0.0 - 0.7 K/uL   Basophils Absolute 0.0 0.0 - 0.1 K/uL  Comprehensive metabolic panel  Result Value Ref Range   Sodium 139 135 - 145 mEq/L   Potassium 4.5 3.5 - 5.1 mEq/L   Chloride 106 96 - 112 mEq/L   CO2 28 19 - 32 mEq/L   Glucose, Bld 77 70 - 99 mg/dL   BUN 9 6 - 23 mg/dL   Creatinine, Ser 0.64 0.40 - 1.20 mg/dL  Total Bilirubin 0.6 0.2 - 1.2 mg/dL   Alkaline Phosphatase 48 39 - 117 U/L   AST 19 0 - 37 U/L   ALT 19 0 - 35 U/L   Total Protein 6.9 6.0 - 8.3 g/dL   Albumin 4.0 3.5 - 5.2 g/dL   Calcium 9.6 8.4 - 10.5 mg/dL   GFR 126.00 >60.00 mL/min  Lipid panel  Result Value Ref Range   Cholesterol 191 0 - 200 mg/dL   Triglycerides 122.0 0.0 - 149.0 mg/dL   HDL 59.40 >39.00 mg/dL   VLDL 24.4 0.0 - 40.0 mg/dL   LDL Cholesterol 107 (H) 0 - 99 mg/dL   Total CHOL/HDL Ratio 3    NonHDL 131.60   TSH  Result Value Ref Range   TSH 1.93 0.35 - 4.50 uIU/mL      Patient Active Problem List   Diagnosis Date Noted  . Screening for HIV (human immunodeficiency virus) 06/07/2014  . Need for hepatitis C screening test 06/07/2014  . Special screening for malignant neoplasms, colon 06/07/2014  . Encounter for repeat Pap smear due to previous insufficient cervical cells 08/14/2012  . Encounter for routine gynecological examination 06/28/2011  . Routine general medical examination at a health care facility 06/26/2010  . Obesity 08/03/2007  . Hyperlipidemia 05/01/2007  . ALLERGIC RHINITIS 05/01/2007  . 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 Allergy     allergic rhinitis  . Hyperlipidemia   . Acne   . Obesity    Past Surgical History  Procedure Laterality Date  . Cesarean section     History  Substance Use Topics  . Smoking status: Never Smoker   . Smokeless tobacco: Not on file  . Alcohol Use: 0.0 oz/week    0 Standard drinks or equivalent per week     Comment: occasional   Family History  Problem Relation Age of Onset  . Hypertension Mother   . Hyperlipidemia Mother   . Cancer Maternal Aunt     cervical CA  . Cancer Paternal Aunt     breast CA   Allergies  Allergen Reactions  . 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 cetirizine-pseudoephedrine (ZYRTEC-D ALLERGY & CONGESTION) 5-120 MG per tablet Take 1 tablet by mouth daily.      . Dapsone (ACZONE) 5 % topical gel Apply 1 application topically 2 (two) times daily.     No current facility-administered medications on file prior to visit.    Review of Systems Review of Systems  Constitutional: Negative for fever, appetite change, fatigue and unexpected weight change.  Eyes: Negative for pain and visual disturbance.  Respiratory: Negative for cough and shortness of breath.   Cardiovascular: Negative for cp or palpitations    Gastrointestinal: Negative for nausea, diarrhea and constipation.  Genitourinary: Negative for urgency and frequency.  Skin: Negative for pallor or rash   Neurological: Negative for weakness, light-headedness, numbness and headaches.  Hematological: Negative for adenopathy. Does not bruise/bleed easily.  Psychiatric/Behavioral: Negative for dysphoric mood. The patient is not nervous/anxious.         Objective:   Physical Exam  Constitutional: She appears well-developed and well-nourished. No distress.  obese and well appearing   HENT:  Head: Normocephalic and atraumatic.  Right Ear: External ear normal.  Left Ear:  External ear normal.  Mouth/Throat: Oropharynx is clear and  moist.  Eyes: Conjunctivae and EOM are normal. Pupils are equal, round, and reactive to light. No scleral icterus.  Neck: Normal range of motion. Neck supple. No JVD present. Carotid bruit is not present. No thyromegaly present.  Cardiovascular: Normal rate, regular rhythm, normal heart sounds and intact distal pulses.  Exam reveals no gallop.   Pulmonary/Chest: Effort normal and breath sounds normal. No respiratory distress. She has no wheezes. She exhibits no tenderness.  Abdominal: Soft. Bowel sounds are normal. She exhibits no distension, no abdominal bruit and no mass. There is no tenderness.  Genitourinary: Uterus normal. No breast swelling, tenderness, discharge or bleeding. There is no rash, tenderness or lesion on the right labia. There is no rash, tenderness or lesion on the left labia. Uterus is not enlarged and not tender. Cervix exhibits no motion tenderness and no friability. Right adnexum displays no mass, no tenderness and no fullness. Left adnexum displays no mass, no tenderness and no fullness. There is bleeding in the vagina.  Pt is at end of menses with light vaginal bleeding   Breast exam: No mass, nodules, thickening, tenderness, bulging, retraction, inflamation, nipple discharge or skin changes noted.  No axillary or clavicular LA.      Musculoskeletal: Normal range of motion. She exhibits no edema or tenderness.  Lymphadenopathy:    She has no cervical adenopathy.  Neurological: She is alert. She has normal reflexes. No cranial nerve deficit. She exhibits normal muscle tone. Coordination normal.  Skin: Skin is warm and dry. No rash noted. No erythema. No pallor.  Psychiatric: She has a normal mood and affect.          Assessment & Plan:   Problem List Items Addressed This Visit    Encounter for routine gynecological examination    Pap and exam done  No symptoms  Still regular menses (which was ending  today-some blood at os on exam) Will continue OC and update if menses become irregular or stop      Relevant Orders   Cytology - PAP   Hyperlipidemia    Disc goals for lipids and reasons to control them Rev labs with pt Rev low sat fat diet in detail Some improvement  Will continue zocor and low sat fat diet       Relevant Medications   simvastatin (ZOCOR) 20 MG tablet   Need for hepatitis C screening test    Pt would like screening  Hep C screen today      Relevant Orders   Hepatitis C Antibody (Completed)   Obesity    Discussed how this problem influences overall health and the risks it imposes  Reviewed plan for weight loss with lower calorie diet (via better food choices and also portion control or program like weight watchers) and exercise building up to or more than 30 minutes 5 days per week including some aerobic activity   Hopefully pt will have more time for self care when working from home        Routine general medical examination at a health care facility - Primary    Reviewed health habits including diet and exercise and skin cancer prevention Reviewed appropriate screening tests for age  Also reviewed health mt list, fam hx and immunization status , as well as social and family history   Labs rev See HPI Gyn exam today Pt also desires Hep C and HIV screening-will do today  Ref for colonoscopy for screening       Screening for HIV (  human immunodeficiency virus)    Pt desires HIV screen  Check today      Relevant Orders   HIV antibody (with reflex) (Completed)   Special screening for malignant neoplasms, colon    Ref for first screening colonoscopy      Relevant Orders   Ambulatory referral to Gastroenterology

## 2014-06-07 NOTE — Assessment & Plan Note (Signed)
Ref for first screening colonoscopy  

## 2014-06-07 NOTE — Assessment & Plan Note (Signed)
Pap and exam done  No symptoms  Still regular menses (which was ending today-some blood at os on exam) Will continue OC and update if menses become irregular or stop

## 2014-06-07 NOTE — Progress Notes (Signed)
Pre visit review using our clinic review tool, if applicable. No additional management support is needed unless otherwise documented below in the visit note. 

## 2014-06-07 NOTE — Assessment & Plan Note (Signed)
Discussed how this problem influences overall health and the risks it imposes  Reviewed plan for weight loss with lower calorie diet (via better food choices and also portion control or program like weight watchers) and exercise building up to or more than 30 minutes 5 days per week including some aerobic activity   Hopefully pt will have more time for self care when working from home

## 2014-06-07 NOTE — Assessment & Plan Note (Signed)
Reviewed health habits including diet and exercise and skin cancer prevention Reviewed appropriate screening tests for age  Also reviewed health mt list, fam hx and immunization status , as well as social and family history   Labs rev See HPI Gyn exam today Pt also desires Hep C and HIV screening-will do today  Ref for colonoscopy for screening

## 2014-06-07 NOTE — Assessment & Plan Note (Signed)
Disc goals for lipids and reasons to control them Rev labs with pt Rev low sat fat diet in detail Some improvement  Will continue zocor and low sat fat diet

## 2014-06-08 LAB — HEPATITIS C ANTIBODY: HCV AB: NEGATIVE

## 2014-06-08 LAB — HIV ANTIBODY (ROUTINE TESTING W REFLEX): HIV: NONREACTIVE

## 2014-06-09 LAB — CYTOLOGY - PAP

## 2014-06-10 ENCOUNTER — Encounter: Payer: Self-pay | Admitting: *Deleted

## 2014-07-01 ENCOUNTER — Other Ambulatory Visit: Payer: Self-pay | Admitting: Family Medicine

## 2014-07-13 ENCOUNTER — Encounter: Payer: Self-pay | Admitting: Internal Medicine

## 2014-09-16 ENCOUNTER — Ambulatory Visit (AMBULATORY_SURGERY_CENTER): Payer: Self-pay | Admitting: *Deleted

## 2014-09-16 VITALS — Ht 62.0 in | Wt 198.6 lb

## 2014-09-16 DIAGNOSIS — Z1211 Encounter for screening for malignant neoplasm of colon: Secondary | ICD-10-CM

## 2014-09-16 NOTE — Progress Notes (Signed)
No allergies to eggs or soy. No problems with anesthesia.  Pt given Emmi instructions for colonoscopy  No oxygen use  No diet drug use  

## 2014-09-30 ENCOUNTER — Encounter: Payer: Self-pay | Admitting: Internal Medicine

## 2014-09-30 ENCOUNTER — Ambulatory Visit (AMBULATORY_SURGERY_CENTER): Payer: Managed Care, Other (non HMO) | Admitting: Internal Medicine

## 2014-09-30 VITALS — BP 115/84 | HR 88 | Temp 97.3°F | Resp 16 | Ht 62.0 in | Wt 198.0 lb

## 2014-09-30 DIAGNOSIS — Z1211 Encounter for screening for malignant neoplasm of colon: Secondary | ICD-10-CM | POA: Diagnosis not present

## 2014-09-30 MED ORDER — SODIUM CHLORIDE 0.9 % IV SOLN: 500.0000 mL | INTRAVENOUS | Status: DC

## 2014-09-30 NOTE — Op Note (Signed)
[facility]  [address] [place], [id number]   COLONOSCOPY PROCEDURE REPORT  PATIENT: Katherine, Schwartz  MR#: [id number] BIRTHDATE: 12-16-1965 , 48  yrs. old GENDER: female ENDOSCOPIST: Gatha Mayer, MD, [facility] PROCEDURE DATE:  09/30/2014 PROCEDURE:   Colonoscopy, screening First Screening Colonoscopy - Avg.  risk and is 50 yrs.  old or older Yes.  Prior Negative Screening - Now for repeat screening. N/A  History of Adenoma - Now for follow-up colonoscopy & has been > or = to 3 yrs.  N/A  Polyps removed today? No Recommend repeat exam, <10 yrs? No ASA CLASS:   Class II INDICATIONS:Screening for colonic neoplasia and Colorectal Neoplasm Risk Assessment for this procedure is average risk. MEDICATIONS: Propofol 200 mg IV and Monitored anesthesia care  DESCRIPTION OF PROCEDURE:   After the risks benefits and alternatives of the procedure were thoroughly explained, informed consent was obtained.  The digital rectal exam revealed no abnormalities of the rectum.   The LB [id number]  endoscope was introduced through the anus and advanced to the cecum, which was identified by both the appendix and ileocecal valve. No adverse events experienced.   The quality of the prep was excellent. (MiraLax was used)  The instrument was then slowly withdrawn as the colon was fully examined. Estimated blood loss is zero unless otherwise noted in this procedure report.      COLON FINDINGS: A normal appearing cecum, ileocecal valve, and appendiceal orifice were identified.  The ascending, transverse, descending, sigmoid colon, and rectum appeared unremarkable. Retroflexed views revealed no abnormalities. The time to cecum = 1.5 Withdrawal time = 9.1   The scope was withdrawn and the procedure completed. COMPLICATIONS: There were no immediate complications.  ENDOSCOPIC IMPRESSION: Normal colonoscopy - excellent prep - first screening  RECOMMENDATIONS: Repeat  colonoscopy/screening test in 2026,  10 years.  eSigned:  Gatha Mayer, MD, [facility] 09/30/2014 10:47 AM   cc: The Patient and Loura Pardon, MD

## 2014-09-30 NOTE — Progress Notes (Signed)
To recovery, report to [place], Therapist, sports, [employer]

## 2014-09-30 NOTE — Patient Instructions (Addendum)
The colonoscopy was normal!  Next routine colonoscopy/screening test in 10 years - 2026  I appreciate the opportunity to care for you. Gatha Mayer, MD, FACG  YOU HAD AN ENDOSCOPIC PROCEDURE TODAY AT [facility]:   Refer to the procedure report that was given to you for any specific questions about what was found during the examination.  If the procedure report does not answer your questions, please call your gastroenterologist to clarify.  If you requested that your care partner not be given the details of your procedure findings, then the procedure report has been included in a sealed envelope for you to review at your convenience later.  YOU SHOULD EXPECT: Some feelings of bloating in the abdomen. Passage of more gas than usual.  Walking can help get rid of the air that was put into your GI tract during the procedure and reduce the bloating. If you had a lower endoscopy (such as a colonoscopy or flexible sigmoidoscopy) you may notice spotting of blood in your stool or on the toilet paper. If you underwent a bowel prep for your procedure, you may not have a normal bowel movement for a few days.  Please Note:  You might notice some irritation and congestion in your nose or some drainage.  This is from the oxygen used during your procedure.  There is no need for concern and it should clear up in a day or so.  SYMPTOMS TO REPORT IMMEDIATELY:   Following lower endoscopy (colonoscopy or flexible sigmoidoscopy):  Excessive amounts of blood in the stool  Significant tenderness or worsening of abdominal pains  Swelling of the abdomen that is new, acute  Fever of 100F or higher   For urgent or emergent issues, a gastroenterologist can be reached at any hour by calling [phone].   DIET: Your first meal following the procedure should be a small meal and then it is ok to progress to your normal diet. Heavy or fried foods are harder to digest and may make you feel  nauseous or bloated.  Likewise, meals heavy in dairy and vegetables can increase bloating.  Drink plenty of fluids but you should avoid alcoholic beverages for 24 hours.  ACTIVITY:  You should plan to take it easy for the rest of today and you should NOT DRIVE or use heavy machinery until tomorrow (because of the sedation medicines used during the test).    FOLLOW UP: Our staff will call the number listed on your records the next business day following your procedure to check on you and address any questions or concerns that you may have regarding the information given to you following your procedure. If we do not reach you, we will leave a message.  However, if you are feeling well and you are not experiencing any problems, there is no need to return our call.  We will assume that you have returned to your regular daily activities without incident.  Read all of the handouts given to you by your recovery room nurse. Thank-you.  If any biopsies were taken you will be contacted by phone or by letter within the next 1-3 weeks.  Please call us at [phone] if you have not heard about the biopsies in 3 weeks.    SIGNATURES/CONFIDENTIALITY: You and/or your care partner have signed paperwork which will be entered into your electronic medical record.  These signatures attest to the fact that that the information above on your After Visit Summary has been  reviewed and is understood.  Full responsibility of the confidentiality of this discharge information lies with you and/or your care-partner. 

## 2014-10-04 ENCOUNTER — Telehealth: Payer: Self-pay

## 2014-10-04 NOTE — Telephone Encounter (Signed)
  Follow up Call-  Call back number 09/30/2014  Post procedure Call Back phone  # [phone]  Permission to leave phone message Yes     Patient questions:  Do you have a fever, pain , or abdominal swelling? No. Pain Score  0 *  Have you tolerated food without any problems? Yes.    Have you been able to return to your normal activities? Yes.    Do you have any questions about your discharge instructions: Diet   No. Medications  No. Follow up visit  No.  Do you have questions or concerns about your Care? No.  Actions: * If pain score is 4 or above: No action needed, pain <4.

## 2015-02-02 ENCOUNTER — Encounter: Payer: Self-pay | Admitting: Family Medicine

## 2015-02-02 ENCOUNTER — Ambulatory Visit (INDEPENDENT_AMBULATORY_CARE_PROVIDER_SITE_OTHER): Payer: Managed Care, Other (non HMO) | Admitting: Family Medicine

## 2015-02-02 VITALS — BP 124/76 | HR 86 | Temp 98.5°F | Ht 62.0 in | Wt 196.8 lb

## 2015-02-02 DIAGNOSIS — J208 Acute bronchitis due to other specified organisms: Secondary | ICD-10-CM | POA: Diagnosis not present

## 2015-02-02 MED ORDER — DOXYCYCLINE HYCLATE 100 MG PO TABS: 100.0000 mg | ORAL_TABLET | Freq: Two times a day (BID) | ORAL | Status: DC

## 2015-02-02 NOTE — Progress Notes (Signed)
Pre visit review using our clinic review tool, if applicable. No additional management support is needed unless otherwise documented below in the visit note. 

## 2015-02-02 NOTE — Progress Notes (Signed)
Dr. Frederico Hamman T. Khiree Bukhari, MD, [facility] [place], [address] Phone: [phone] Fax: [phone]  02/02/2015  Patient: Katherine Schwartz, MRN: [id number], DOB: 01/06/1966, 50 y.o.  Primary Physician:  Loura Pardon, MD   Chief Complaint  Patient presents with  . Cough    with chest tightness & low back pain  . Sinus Drainage   Subjective:   Katherine Schwartz is a 50 y.o. very pleasant female patient who presents with the following:  Patient presents for presents evaluation of myalgias, nasal congestion, productive cough, rhinorrhea , sneezing and sore throat. Symptoms began > 1 week ago and are gradually worsening since that time.   Sinus drainage, sinus drainage and body aches. About a week ago, coughing again.  Worst part is tightness in chest.   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  Risk Factors:  The patient denies significant nausea, vomitting, diarrhea, rash, diffuse arthralgia or myalgia. They also deny high fever.   Past Medical History, Surgical History, Social History, Family History, Problem List, Medications, and Allergies have been reviewed and updated if relevant.  Patient Active Problem List   Diagnosis Date Noted  . Screening for HIV (human immunodeficiency virus) 06/07/2014  . Need for hepatitis C screening test 06/07/2014  . Special screening for malignant neoplasms, colon 06/07/2014  . Encounter for repeat Pap smear due to previous insufficient cervical cells 08/14/2012  . Encounter for routine gynecological examination 06/28/2011  . Routine general medical examination at a health care facility 06/26/2010  . Obesity 08/03/2007  . Hyperlipidemia 05/01/2007  . ALLERGIC RHINITIS 05/01/2007  . 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 Allergy     allergic rhinitis  . Hyperlipidemia   . Acne   . Obesity     Past Surgical History  Procedure Laterality Date  .  Cesarean section  1994    Social History   Social History  . Marital Status: Married    Spouse Name: N/A  . Number of Children: N/A  . Years of Education: N/A   Occupational History  . Not on file.   Social History Main Topics  . Smoking status: Never Smoker   . Smokeless tobacco: Never Used  . Alcohol Use: 0.0 oz/week    0 Standard drinks or equivalent per week     Comment: 1 drink a month  . Drug Use: No  . Sexual Activity: Not on file   Other Topics Concern  . Not on file   Social History Narrative    Family History  Problem Relation Age of Onset  . Hypertension Mother   . Hyperlipidemia Mother   . Cancer Maternal Aunt     cervical CA  . Cancer Paternal Aunt     breast CA  . Colon cancer Maternal Grandmother 56  . Esophageal cancer Neg Hx   . Stomach cancer Neg Hx   . Rectal cancer Neg Hx     Allergies  Allergen Reactions  . Other Nausea And Vomiting    PINE NUTS IN PESTO     Medication list reviewed and updated in full in [facility].  ROS: GEN: Acute illness details above GI: Tolerating PO intake GU: maintaining adequate hydration and urination Pulm: No SOB Interactive and getting along well at home.  Otherwise, ROS is as per the HPI.   Objective:   BP 124/76 mmHg  Pulse 86  Temp(Src) 98.5  F (36.9 C) (Oral)  Ht 5\' 2"  (1.575 m)  Wt 196 lb 12 oz (89.245 kg)  BMI 35.98 kg/m2  SpO2 100%  LMP 01/12/2015   GEN: A and O x 3. WDWN. NAD.    ENT: Nose clear, ext NML.  No LAD.  No JVD.  TM's clear. Oropharynx clear.  PULM: Normal WOB, no distress. No crackles, wheezes, rhonchi. CV: RRR, no M/G/R, No rubs, No JVD.   EXT: warm and well-perfused, No c/c/e. PSYCH: Pleasant and conversant.   Laboratory and Imaging Data: No results found.  Assessment and Plan:   Acute bronchitis due to other specified organisms   At this point, we reviewed supportive care. Given the length of time and risk factors, treat with medications  below.  Medical decision making includes all plans, orders, medications, and patient instructions reviewed face to face.   Follow-up: No Follow-up on file.  New Prescriptions   DOXYCYCLINE (VIBRA-TABS) 100 MG TABLET    Take 1 tablet (100 mg total) by mouth 2 (two) times daily.   Modified Medications   No medications on file   No orders of the defined types were placed in this encounter.    Signed,  Maud Deed. Dakai Braithwaite, MD   Patient's Medications  New Prescriptions   DOXYCYCLINE (VIBRA-TABS) 100 MG TABLET    Take 1 tablet (100 mg total) by mouth 2 (two) times daily.  Previous Medications   CETIRIZINE-PSEUDOEPHEDRINE (ZYRTEC-D ALLERGY & CONGESTION) 5-120 MG PER TABLET    Take 1 tablet by mouth daily.     DAPSONE (ACZONE) 5 % TOPICAL GEL    Apply 1 application topically 2 (two) times daily.   ORTHO TRI-CYCLEN, 28, 0.18/0.215/0.25 MG-35 MCG TABLET    TAKE 1 TABLET BY MOUTH EVERY DAY   SIMVASTATIN (ZOCOR) 20 MG TABLET    Take 1 tablet (20 mg total) by mouth at bedtime.   TRETINOIN (RETIN-A) 0.025 % CREAM    Apply 1 application topically at bedtime as needed.  Modified Medications   No medications on file  Discontinued Medications   No medications on file

## 2015-03-01 ENCOUNTER — Other Ambulatory Visit: Payer: Self-pay | Admitting: *Deleted

## 2015-03-01 MED ORDER — ORTHO TRI-CYCLEN (28) 0.18/0.215/0.25 MG-35 MCG PO TABS: 1.0000 | ORAL_TABLET | Freq: Every day | ORAL | Status: DC

## 2015-03-01 NOTE — Telephone Encounter (Signed)
Received fax from [facility] requesting Rx to be changed to a 90 day supply, done

## 2015-03-27 ENCOUNTER — Other Ambulatory Visit: Payer: Self-pay

## 2015-03-27 DIAGNOSIS — Z1231 Encounter for screening mammogram for malignant neoplasm of breast: Secondary | ICD-10-CM

## 2015-05-04 ENCOUNTER — Ambulatory Visit
Admission: RE | Admit: 2015-05-04 | Discharge: 2015-05-04 | Disposition: A | Discharge status: Home / Self Care | Payer: Managed Care, Other (non HMO) | Source: Ambulatory Visit

## 2015-05-04 DIAGNOSIS — Z1231 Encounter for screening mammogram for malignant neoplasm of breast: Secondary | ICD-10-CM

## 2015-05-04 LAB — HM MAMMOGRAPHY: HM MAMMO: NORMAL (ref 0–4)

## 2015-05-05 ENCOUNTER — Encounter: Payer: Self-pay | Admitting: *Deleted

## 2015-06-23 ENCOUNTER — Other Ambulatory Visit: Payer: Self-pay

## 2015-06-23 MED ORDER — SIMVASTATIN 20 MG PO TABS: 20.0000 mg | ORAL_TABLET | Freq: Every day | ORAL | Status: DC

## 2015-06-23 NOTE — Telephone Encounter (Signed)
Pt has CPX scheduled 11/24/15 and request refill simvastatin until seen; pt last annual 06/07/14. Refill done as requested and pt voiced understanding.

## 2015-07-01 ENCOUNTER — Other Ambulatory Visit: Payer: Self-pay | Admitting: Family Medicine

## 2015-07-11 ENCOUNTER — Other Ambulatory Visit: Payer: Self-pay | Admitting: *Deleted

## 2015-07-11 MED ORDER — SIMVASTATIN 20 MG PO TABS: 20.0000 mg | ORAL_TABLET | Freq: Every day | ORAL | Status: DC

## 2015-07-11 NOTE — Telephone Encounter (Signed)
Received fax requesting Rx to be changed to 90 day, done

## 2015-09-18 ENCOUNTER — Other Ambulatory Visit: Payer: Self-pay | Admitting: Family Medicine

## 2015-11-05 ENCOUNTER — Other Ambulatory Visit: Payer: Self-pay | Admitting: Family Medicine

## 2015-11-19 ENCOUNTER — Telehealth: Payer: Self-pay | Admitting: Family Medicine

## 2015-11-19 DIAGNOSIS — Z0000 Encounter for general adult medical examination without abnormal findings: Secondary | ICD-10-CM

## 2015-11-19 NOTE — Telephone Encounter (Signed)
-----   Message from Ellamae Sia sent at 11/14/2015 11:06 AM EDT ----- Regarding: Lab orders for Monday, 10.23.17 Patient is scheduled for CPX labs, please order future labs, Thanks , Karna Christmas

## 2015-11-20 ENCOUNTER — Other Ambulatory Visit (INDEPENDENT_AMBULATORY_CARE_PROVIDER_SITE_OTHER): Payer: Managed Care, Other (non HMO)

## 2015-11-20 DIAGNOSIS — Z0000 Encounter for general adult medical examination without abnormal findings: Secondary | ICD-10-CM | POA: Diagnosis not present

## 2015-11-20 LAB — CBC WITH DIFFERENTIAL/PLATELET
Basophils Absolute: 0 10*3/uL (ref 0.0–0.1)
Basophils Relative: 1 % (ref 0.0–3.0)
EOS PCT: 4.8 % (ref 0.0–5.0)
Eosinophils Absolute: 0.2 10*3/uL (ref 0.0–0.7)
HCT: 39 % (ref 36.0–46.0)
Hemoglobin: 13.2 g/dL (ref 12.0–15.0)
LYMPHS ABS: 1.7 10*3/uL (ref 0.7–4.0)
Lymphocytes Relative: 38.6 % (ref 12.0–46.0)
MCHC: 33.8 g/dL (ref 30.0–36.0)
MCV: 95.5 fl (ref 78.0–100.0)
MONO ABS: 0.5 10*3/uL (ref 0.1–1.0)
Monocytes Relative: 11.5 % (ref 3.0–12.0)
NEUTROS ABS: 2 10*3/uL (ref 1.4–7.7)
NEUTROS PCT: 44.1 % (ref 43.0–77.0)
PLATELETS: 238 10*3/uL (ref 150.0–400.0)
RBC: 4.08 Mil/uL (ref 3.87–5.11)
RDW: 12.8 % (ref 11.5–15.5)
WBC: 4.5 10*3/uL (ref 4.0–10.5)

## 2015-11-20 LAB — COMPREHENSIVE METABOLIC PANEL
ALBUMIN: 4 g/dL (ref 3.5–5.2)
ALK PHOS: 42 U/L (ref 39–117)
ALT: 23 U/L (ref 0–35)
AST: 22 U/L (ref 0–37)
BUN: 14 mg/dL (ref 6–23)
CHLORIDE: 106 meq/L (ref 96–112)
CO2: 28 mEq/L (ref 19–32)
Calcium: 9.3 mg/dL (ref 8.4–10.5)
Creatinine, Ser: 0.72 mg/dL (ref 0.40–1.20)
GFR: 109.35 mL/min (ref >60.00)
Glucose, Bld: 83 mg/dL (ref 70–99)
POTASSIUM: 4.2 meq/L (ref 3.5–5.1)
SODIUM: 139 meq/L (ref 135–145)
TOTAL PROTEIN: 7.1 g/dL (ref 6.0–8.3)
Total Bilirubin: 0.5 mg/dL (ref 0.2–1.2)

## 2015-11-20 LAB — LIPID PANEL
CHOLESTEROL: 224 mg/dL — AB (ref 0–200)
HDL: 61.2 mg/dL (ref >39.00)
LDL CALC: 135 mg/dL — AB (ref 0–99)
NonHDL: 162.96
Total CHOL/HDL Ratio: 4
Triglycerides: 139 mg/dL (ref 0.0–149.0)
VLDL: 27.8 mg/dL (ref 0.0–40.0)

## 2015-11-20 LAB — TSH: TSH: 2.15 u[IU]/mL (ref 0.35–4.50)

## 2015-11-24 ENCOUNTER — Encounter: Payer: Managed Care, Other (non HMO) | Admitting: Family Medicine

## 2015-11-27 ENCOUNTER — Ambulatory Visit (INDEPENDENT_AMBULATORY_CARE_PROVIDER_SITE_OTHER): Payer: Managed Care, Other (non HMO) | Admitting: Family Medicine

## 2015-11-27 ENCOUNTER — Encounter: Payer: Self-pay | Admitting: Family Medicine

## 2015-11-27 VITALS — BP 124/78 | HR 82 | Temp 98.2°F | Ht 62.0 in | Wt 188.5 lb

## 2015-11-27 DIAGNOSIS — E6609 Other obesity due to excess calories: Secondary | ICD-10-CM

## 2015-11-27 DIAGNOSIS — E7800 Pure hypercholesterolemia, unspecified: Secondary | ICD-10-CM

## 2015-11-27 DIAGNOSIS — Z6834 Body mass index (BMI) 34.0-34.9, adult: Secondary | ICD-10-CM

## 2015-11-27 DIAGNOSIS — Z23 Encounter for immunization: Secondary | ICD-10-CM | POA: Diagnosis not present

## 2015-11-27 DIAGNOSIS — Z0000 Encounter for general adult medical examination without abnormal findings: Secondary | ICD-10-CM | POA: Diagnosis not present

## 2015-11-27 MED ORDER — ORTHO TRI-CYCLEN (28) 0.18/0.215/0.25 MG-35 MCG PO TABS: 1.0000 | ORAL_TABLET | Freq: Every day | ORAL | 3 refills | Status: DC

## 2015-11-27 MED ORDER — TETANUS-DIPHTH-ACELL PERTUSSIS 5-2.5-18.5 LF-MCG/0.5 IM SUSP
0.5000 mL | Freq: Once | INTRAMUSCULAR | Status: AC
Start: 2015-11-27 — End: 2015-11-27
  Administered 2015-11-27: 0.5 mL via INTRAMUSCULAR

## 2015-11-27 MED ORDER — SIMVASTATIN 20 MG PO TABS: 20.0000 mg | ORAL_TABLET | Freq: Every day | ORAL | 3 refills | Status: DC

## 2015-11-27 NOTE — Progress Notes (Signed)
Subjective:    Patient ID: Katherine Schwartz, female    DOB: [birth year], 50 y.o.   MRN: [id number]  HPI Here for health maintenance exam and to review chronic medical problems    Has had some vacation  Feeling ok   Wt Readings from Last 3 Encounters:  11/27/15 188 lb 8 oz (85.5 kg)  02/02/15 196 lb 12 oz (89.2 kg)  09/30/14 198 lb (89.8 kg)  is loosing weight (lost more and gained a little on vacation) Joined the gym- goes 4 times per week  Also a healthier diet - tries to keep unhealthy snacks out of the house  Her craving for sweets fluctuates / likes carbs  bmi is 34.4  May be allergic to [place] , spinach and bananas  Also chocolate  Causes face to tingle or flush   Tetanus shot 2/07 Is due for that- will get today   [facility] shot - [facility]   Mammogram 4/17-normal  Self breast exam - no lumps   Pap 5/16-normal  Past 3 paps are all normal Still having menses -waiting for them to stop ( a little heavier occ)  Some hot flashes  On ortho tri cyclen - doing fine with that  No gyn symptoms  No new partners   Colonoscopy 9/16-normal with 10 y recall   Hx of hyperlipidemia Lab Results  Component Value Date   CHOL 224 (H) 11/20/2015   CHOL 191 06/01/2014   CHOL 202 (H) 10/27/2013   Lab Results  Component Value Date   HDL 61.20 11/20/2015   HDL 59.40 06/01/2014   HDL 54.40 10/27/2013   Lab Results  Component Value Date   LDLCALC 135 (H) 11/20/2015   LDLCALC 107 (H) 06/01/2014   LDLCALC 116 (H) 10/27/2013   Lab Results  Component Value Date   TRIG 139.0 11/20/2015   TRIG 122.0 06/01/2014   TRIG 160.0 (H) 10/27/2013   Lab Results  Component Value Date   CHOLHDL 4 11/20/2015   CHOLHDL 3 06/01/2014   CHOLHDL 4 10/27/2013   Lab Results  Component Value Date   LDLDIRECT 176.2 09/22/2008   LDLDIRECT 141.6 06/17/2008   LDLDIRECT 131.2 12/08/2007   On simvastatin and diet  Has missed some doses  Not consistent  Plans on being better with that    Results for orders placed or performed in visit on 11/20/15  CBC with Differential/Platelet  Result Value Ref Range   WBC 4.5 4.0 - 10.5 K/uL   RBC 4.08 3.87 - 5.11 Mil/uL   Hemoglobin 13.2 12.0 - 15.0 g/dL   HCT 39.0 36.0 - 46.0 %   MCV 95.5 78.0 - 100.0 fl   MCHC 33.8 30.0 - 36.0 g/dL   RDW 12.8 11.5 - 15.5 %   Platelets 238.0 150.0 - 400.0 K/uL   Neutrophils Relative % 44.1 43.0 - 77.0 %   Lymphocytes Relative 38.6 12.0 - 46.0 %   Monocytes Relative 11.5 3.0 - 12.0 %   Eosinophils Relative 4.8 0.0 - 5.0 %   Basophils Relative 1.0 0.0 - 3.0 %   Neutro Abs 2.0 1.4 - 7.7 K/uL   Lymphs Abs 1.7 0.7 - 4.0 K/uL   Monocytes Absolute 0.5 0.1 - 1.0 K/uL   Eosinophils Absolute 0.2 0.0 - 0.7 K/uL   Basophils Absolute 0.0 0.0 - 0.1 K/uL  Comprehensive metabolic panel  Result Value Ref Range   Sodium 139 135 - 145 mEq/L   Potassium 4.2 3.5 - 5.1 mEq/L  Chloride 106 96 - 112 mEq/L   CO2 28 19 - 32 mEq/L   Glucose, Bld 83 70 - 99 mg/dL   BUN 14 6 - 23 mg/dL   Creatinine, Ser 0.72 0.40 - 1.20 mg/dL   Total Bilirubin 0.5 0.2 - 1.2 mg/dL   Alkaline Phosphatase 42 39 - 117 U/L   AST 22 0 - 37 U/L   ALT 23 0 - 35 U/L   Total Protein 7.1 6.0 - 8.3 g/dL   Albumin 4.0 3.5 - 5.2 g/dL   Calcium 9.3 8.4 - 10.5 mg/dL   GFR 109.35 >60.00 mL/min  Lipid panel  Result Value Ref Range   Cholesterol 224 (H) 0 - 200 mg/dL   Triglycerides 139.0 0.0 - 149.0 mg/dL   HDL 61.20 >39.00 mg/dL   VLDL 27.8 0.0 - 40.0 mg/dL   LDL Cholesterol 135 (H) 0 - 99 mg/dL   Total CHOL/HDL Ratio 4    NonHDL 162.96   TSH  Result Value Ref Range   TSH 2.15 0.35 - 4.50 uIU/mL     Patient Active Problem List   Diagnosis Date Noted  . Screening for HIV (human immunodeficiency virus) 06/07/2014  . Need for hepatitis C screening test 06/07/2014  . Special screening for malignant neoplasms, colon 06/07/2014  . Encounter for repeat Pap smear due to previous insufficient cervical cells 08/14/2012  . Encounter for  routine gynecological examination 06/28/2011  . Routine general medical examination at a health care facility 06/26/2010  . Obesity 08/03/2007  . Hyperlipidemia 05/01/2007  . ALLERGIC RHINITIS 05/01/2007  . 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 Acne   . Allergy    allergic rhinitis  . Hyperlipidemia   . Obesity    Past Surgical History:  Procedure Laterality Date  . [place]   Social History  Substance Use Topics  . Smoking status: Never Smoker  . Smokeless tobacco: Never Used  . Alcohol use 0.0 oz/week     Comment: socially, rare   Family History  Problem Relation Age of Onset  . Colon cancer Maternal Grandmother 15  . Hypertension Mother   . Hyperlipidemia Mother   . Cancer Maternal Aunt     cervical CA  . Cancer Paternal Aunt     breast CA  . Esophageal cancer Neg Hx   . Stomach cancer Neg Hx   . Rectal cancer Neg Hx    Allergies  Allergen Reactions  . 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 cetirizine-pseudoephedrine (ZYRTEC-D ALLERGY & CONGESTION) 5-120 MG per tablet Take 1 tablet by mouth daily.       No current facility-administered medications on file prior to visit.     Review of Systems Review of Systems  Constitutional: Negative for fever, appetite change, fatigue and unexpected weight change.  Eyes: Negative for pain and visual disturbance.  Respiratory: Negative for cough and shortness of breath.   Cardiovascular: Negative for cp or palpitations    Gastrointestinal: Negative for nausea, diarrhea and constipation.  Genitourinary: Negative for urgency and frequency.  Skin: Negative for pallor or rash   Neurological: Negative for weakness, light-headedness, numbness and headaches.  Hematological: Negative for adenopathy. Does not bruise/bleed easily.  Psychiatric/Behavioral: Negative for dysphoric mood.  The patient is not nervous/anxious.         Objective:  Physical Exam  Constitutional: She appears well-developed and well-nourished. No distress.  obese and well appearing   HENT:  Head: Normocephalic and atraumatic.  Right Ear: External ear normal.  Left Ear: External ear normal.  Mouth/Throat: Oropharynx is clear and moist.  Eyes: Conjunctivae and EOM are normal. Pupils are equal, round, and reactive to light. No scleral icterus.  Neck: Normal range of motion. Neck supple. No JVD present. Carotid bruit is not present. No thyromegaly present.  Cardiovascular: Normal rate, regular rhythm, normal heart sounds and intact distal pulses.  Exam reveals no gallop.   Pulmonary/Chest: Effort normal and breath sounds normal. No respiratory distress. She has no wheezes. She exhibits no tenderness.  Abdominal: Soft. Bowel sounds are normal. She exhibits no distension, no abdominal bruit and no mass. There is no tenderness.  Genitourinary: No breast swelling, tenderness, discharge or bleeding.  Genitourinary Comments: Breast exam: No mass, nodules, thickening, tenderness, bulging, retraction, inflamation, nipple discharge or skin changes noted.  No axillary or clavicular LA.      Musculoskeletal: Normal range of motion. She exhibits no edema or tenderness.  Lymphadenopathy:    She has no cervical adenopathy.  Neurological: She is alert. She has normal reflexes. No cranial nerve deficit. She exhibits normal muscle tone. Coordination normal.  Skin: Skin is warm and dry. No rash noted. No erythema. No pallor.  Skin tags on neck/trunk  Psychiatric: She has a normal mood and affect.          Assessment & Plan:   Problem List Items Addressed This Visit      Other   Hyperlipidemia - Primary    Disc goals for lipids and reasons to control them Rev labs with pt Rev low sat fat diet in detail Enc her to be more compliant with statin-missing doses and LDL is up      Relevant Medications    simvastatin (ZOCOR) 20 MG tablet   Obesity    Discussed how this problem influences overall health and the risks it imposes  Reviewed plan for weight loss with lower calorie diet (via better food choices and also portion control or program like weight watchers) and exercise building up to or more than 30 minutes 5 days per week including some aerobic activity         Routine general medical examination at a health care facility    Reviewed health habits including diet and exercise and skin cancer prevention Reviewed appropriate screening tests for age  Also reviewed health mt list, fam hx and immunization status , as well as social and family history   See HPI Labs reviewed If you are interested in a shingles/zoster vaccine - call your insurance to check on coverage,( you should not get it within 1 month of other vaccines) , then call us for a prescription  for it to take to a pharmacy that gives the shot , or make a nurse visit to get it here depending on your coverage Please work on compliance with cholesterol medicine-your numbers are up  Keep working on fitness and weight loss   Tetanus shot (Tdap) today       Other Visit Diagnoses    Need for Tdap vaccination       Relevant Medications   Tdap (BOOSTRIX) injection 0.5 mL (Completed)

## 2015-11-27 NOTE — Assessment & Plan Note (Signed)
Reviewed health habits including diet and exercise and skin cancer prevention Reviewed appropriate screening tests for age  Also reviewed health mt list, fam hx and immunization status , as well as social and family history   See HPI Labs reviewed If you are interested in a shingles/zoster vaccine - call your insurance to check on coverage,( you should not get it within 1 month of other vaccines) , then call us for a prescription  for it to take to a pharmacy that gives the shot , or make a nurse visit to get it here depending on your coverage Please work on compliance with cholesterol medicine-your numbers are up  Keep working on fitness and weight loss   Tetanus shot (Tdap) today

## 2015-11-27 NOTE — Progress Notes (Signed)
Pre visit review using our clinic review tool, if applicable. No additional management support is needed unless otherwise documented below in the visit note. 

## 2015-11-27 NOTE — Assessment & Plan Note (Signed)
Disc goals for lipids and reasons to control them Rev labs with pt Rev low sat fat diet in detail Enc her to be more compliant with statin-missing doses and LDL is up

## 2015-11-27 NOTE — Assessment & Plan Note (Signed)
Discussed how this problem influences overall health and the risks it imposes  Reviewed plan for weight loss with lower calorie diet (via better food choices and also portion control or program like weight watchers) and exercise building up to or more than 30 minutes 5 days per week including some aerobic activity    

## 2015-11-27 NOTE — Patient Instructions (Addendum)
If you are interested in a shingles/zoster vaccine - call your insurance to check on coverage,( you should not get it within 1 month of other vaccines) , then call us for a prescription  for it to take to a pharmacy that gives the shot , or make a nurse visit to get it here depending on your coverage Please work on compliance with cholesterol medicine-your numbers are up  Keep working on fitness and weight loss   Tetanus shot (Tdap) today   Take care of yourself

## 2015-12-09 ENCOUNTER — Other Ambulatory Visit: Payer: Self-pay | Admitting: Family Medicine

## 2016-01-04 ENCOUNTER — Ambulatory Visit (INDEPENDENT_AMBULATORY_CARE_PROVIDER_SITE_OTHER): Payer: Managed Care, Other (non HMO) | Admitting: Urology

## 2016-01-04 ENCOUNTER — Encounter: Payer: Self-pay | Admitting: Urology

## 2016-01-04 VITALS — BP 138/83 | HR 92 | Ht 62.0 in | Wt 189.5 lb

## 2016-01-04 DIAGNOSIS — R3129 Other microscopic hematuria: Secondary | ICD-10-CM

## 2016-01-04 DIAGNOSIS — R109 Unspecified abdominal pain: Secondary | ICD-10-CM

## 2016-01-04 LAB — URINALYSIS, COMPLETE
Bilirubin, UA: NEGATIVE
GLUCOSE, UA: NEGATIVE
Ketones, UA: NEGATIVE
LEUKOCYTES UA: NEGATIVE
Nitrite, UA: NEGATIVE
PROTEIN UA: NEGATIVE
Specific Gravity, UA: 1.02 (ref 1.005–1.030)
Urobilinogen, Ur: 0.2 mg/dL (ref 0.2–1.0)
pH, UA: 5.5 (ref 5.0–7.5)

## 2016-01-04 LAB — MICROSCOPIC EXAMINATION

## 2016-01-04 NOTE — Progress Notes (Signed)
01/04/2016 3:44 PM   Katherine Schwartz [birth year] [id number]  Referring provider: Abner Greenspan, MD [address] [place], [place] [address]  Chief Complaint  Patient presents with  . Flank Pain    HPI: 50 year old female who presents today for follow-up from an urgent care visit with left flank pain and microscopic hematuria. She was seen at urgent care at [facility] on 12/30/2015 at which time she was concerned about a possible urinary tract infection. Her UA at that time was negative other than for microscopic hematuria. In addition, she had some mild dull left flank pain and pelvic pain.  KUB obtained the time per report shows a possible left renal stone, otherwise was unremarkable. No cross-sectional imaging was obtained.  No associated urinary frequency, urgency, or dysuria. No fevers or chills.   No n/v.  Mild constipation chronically, on stool softener.    She denies a personal history of kidney stones. No family history of kidney stones.  No history of UTIs.    PMH: Past Medical History:  Diagnosis Date  . Acne   . Allergy    allergic rhinitis  . Hyperlipidemia   . Obesity     Surgical History: Past Surgical History:  Procedure Laterality Date  . CESAREAN SECTION  1994    Home Medications:    Medication List       Accurate as of 01/04/16  3:44 PM. Always use your most recent med list.          ORTHO TRI-CYCLEN (28) 0.18/0.215/0.25 MG-35 MCG tablet Generic drug:  Norgestimate-Ethinyl Estradiol Triphasic Take 1 tablet by mouth daily.   simvastatin 20 MG tablet Commonly known as:  ZOCOR Take 1 tablet (20 mg total) by mouth at bedtime.   ZYRTEC-D ALLERGY & CONGESTION 5-120 MG tablet Generic drug:  cetirizine-pseudoephedrine Take 1 tablet by mouth daily.       Allergies:  Allergies  Allergen Reactions  . Other Nausea And Vomiting    PINE NUTS IN PESTO     Family History: Family History  Problem Relation Age of Onset  .  Colon cancer Maternal Grandmother 44  . Hypertension Mother   . Hyperlipidemia Mother   . Cancer Maternal Aunt     cervical CA  . Cancer Paternal Aunt     breast CA  . Esophageal cancer Neg Hx   . Stomach cancer Neg Hx   . Rectal cancer Neg Hx   . Bladder Cancer Neg Hx   . Kidney cancer Neg Hx     Social History:  reports that she has never smoked. She has never used smokeless tobacco. She reports that she drinks alcohol. She reports that she does not use drugs.  ROS: UROLOGY Frequent Urination?: Yes Hard to postpone urination?: No Burning/pain with urination?: No Get up at night to urinate?: Yes Leakage of urine?: No Urine stream starts and stops?: No Trouble starting stream?: No Do you have to strain to urinate?: Yes Blood in urine?: Yes Urinary tract infection?: No Sexually transmitted disease?: No Injury to kidneys or bladder?: No Painful intercourse?: Yes Weak stream?: No Currently pregnant?: No Vaginal bleeding?: No Last menstrual period?: n  Gastrointestinal Nausea?: No Vomiting?: No Indigestion/heartburn?: Yes Diarrhea?: No Constipation?: No  Constitutional Fever: No Night sweats?: No Weight loss?: No Fatigue?: No  Skin Skin rash/lesions?: No Itching?: No  Eyes Blurred vision?: No Double vision?: No  Ears/Nose/Throat Sore throat?: No Sinus problems?: Yes  Hematologic/Lymphatic Swollen glands?: No Easy bruising?: No  Cardiovascular Leg swelling?: No Chest pain?: No  Respiratory Cough?: No Shortness of breath?: No  Endocrine Excessive thirst?: No  Musculoskeletal Back pain?: No Joint pain?: No  Neurological Headaches?: No Dizziness?: No  Psychologic Depression?: No Anxiety?: No  Physical Exam: BP 138/83 (BP Location: Left Arm, Patient Position: Sitting, Cuff Size: Large)   Pulse 92   Ht 5\' 2"  (1.575 m)   Wt 189 lb 8 oz (86 kg)   LMP 12/16/2015   BMI 34.66 kg/m   Constitutional:  Alert and oriented, No acute  distress. HEENT: [place] AT, moist mucus membranes.  Trachea midline, no masses. Cardiovascular: No clubbing, cyanosis.  Mild bilateral lower extremity edema.  Respiratory: Normal respiratory effort, no increased work of breathing. GI: Abdomen is soft, nondistended, no abdominal masses.  Mild left lower quadrant tenderness with deep palpation. GU: No CVA tenderness.  Skin: No rashes, bruises or suspicious lesions. Neurologic: Grossly intact, no focal deficits, moving all 4 extremities. Psychiatric: Normal mood and affect.  Laboratory Data: Lab Results  Component Value Date   WBC 4.5 11/20/2015   HGB 13.2 11/20/2015   HCT 39.0 11/20/2015   MCV 95.5 11/20/2015   PLT 238.0 11/20/2015    Lab Results  Component Value Date   CREATININE 0.72 11/20/2015   Urinalysis Urinalysis w/Microscopic (12/30/2015 10:16 AM) Urinalysis w/Microscopic (12/30/2015 10:16 AM)  Component Value Ref Range  Color Yellow Yellow, Straw, Blue  Clarity Clear Clear, Slightly Cloudy, Turbid Other  Specific Gravity 1.015 1.000 - 1.030  pH, Urine 5.5 5.0 - 8.0  Protein, Urinalysis Negative Negative, Trace mg/dL  Glucose, Urinalysis Negative Negative mg/dL  Ketones, Urinalysis Negative Negative mg/dL  Blood, Urinalysis Large (A) Negative  Nitrite, Urinalysis Negative Negative  Leukocyte Esterase, Urinalysis Negative Negative  White Blood Cells, Urinalysis None Seen None Seen, 0-3 /hpf  Red Blood Cells, Urinalysis 4-10 (A) None Seen, 0-3 /hpf  Bacteria, Urinalysis Few (A) None Seen /hpf  Squamous Epithelial Cells, Urinalysis Rare Rare, Few, None Seen /hpf     Results for orders placed or performed in visit on 01/04/16  Microscopic Examination  Result Value Ref Range   WBC, UA 0-5 0 - 5 /hpf   RBC, UA >30 (A) 0 - 2 /hpf   Epithelial Cells (non renal) 0-10 0 - 10 /hpf   Crystals Present (A) N/A   Crystal Type Calcium Oxalate N/A   Bacteria, UA Few None seen/Few   Yeast, UA Present (A) None seen  Urinalysis,  Complete  Result Value Ref Range   Specific Gravity, UA 1.020 1.005 - 1.030   pH, UA 5.5 5.0 - 7.5   Color, UA Yellow Yellow   Appearance Ur Clear Clear   Leukocytes, UA Negative Negative   Protein, UA Negative Negative/Trace   Glucose, UA Negative Negative   Ketones, UA Negative Negative   RBC, UA 3+ (A) Negative   Bilirubin, UA Negative Negative   Urobilinogen, Ur 0.2 0.2 - 1.0 mg/dL   Nitrite, UA Negative Negative   Microscopic Examination See below:      Pertinent Imaging: KUB report reviewed, not able to view imagines  Assessment & Plan:    1. Left flank pain Mild left flank pain with associated microscopic hematuria and calcium oxalate crystals in urine. Highly suspect obstructing ureteral stone.  I have recommended a stone CT to call CT scan to identify the size and location of her overall stone burden. We'll call her with these results to discuss need for possible intervention versus medical expulsive therapy.  In the meantime, warning symptoms are reviewed today in detail. These include fever, chills, urinary tract infections, inability to tolerate fluids, etc. All of her questions were answered.   - Urinalysis, Complete - CT RENAL STONE STUDY; Future  2. Microscopic hematuria Likely secondary to stone, will repeat urinalysis in the future after presumed stone episode has resolved  Return for will call with CT results.  Hollice Espy, MD  [facility] [address], [place], [place] [address] [phone]

## 2016-01-12 ENCOUNTER — Telehealth: Payer: Self-pay | Admitting: Urology

## 2016-01-12 ENCOUNTER — Ambulatory Visit
Admission: RE | Admit: 2016-01-12 | Discharge: 2016-01-12 | Disposition: A | Discharge status: Home / Self Care | Payer: Managed Care, Other (non HMO) | Source: Ambulatory Visit | Attending: Urology | Admitting: Urology

## 2016-01-12 DIAGNOSIS — N200 Calculus of kidney: Secondary | ICD-10-CM | POA: Insufficient documentation

## 2016-01-12 DIAGNOSIS — D259 Leiomyoma of uterus, unspecified: Secondary | ICD-10-CM | POA: Diagnosis not present

## 2016-01-12 DIAGNOSIS — R109 Unspecified abdominal pain: Secondary | ICD-10-CM | POA: Diagnosis present

## 2016-01-12 NOTE — Telephone Encounter (Signed)
Reviewed CT scan results, no ureteral stones.  nonobstructing 6 mm left renal stone.    Pain is now dull and less severe.    Suspect may have passes a stone just prior to study.  Recommend f/u in 3 months for UA, KUB, and possible cystoscopy.     Hollice Espy, MD

## 2016-01-19 ENCOUNTER — Telehealth: Payer: Self-pay | Admitting: Urology

## 2016-01-19 NOTE — Telephone Encounter (Signed)
Appointment made and mailed to patient Could not reach her so I left a message for her to cb to confirm appt   Sharyn Lull

## 2016-03-22 ENCOUNTER — Other Ambulatory Visit: Payer: Self-pay | Admitting: Family Medicine

## 2016-03-22 DIAGNOSIS — Z1231 Encounter for screening mammogram for malignant neoplasm of breast: Secondary | ICD-10-CM

## 2016-04-17 ENCOUNTER — Ambulatory Visit
Admission: RE | Admit: 2016-04-17 | Discharge: 2016-04-17 | Disposition: A | Discharge status: Home / Self Care | Payer: Managed Care, Other (non HMO) | Source: Ambulatory Visit | Attending: Urology | Admitting: Urology

## 2016-04-17 ENCOUNTER — Encounter: Payer: Self-pay | Admitting: Urology

## 2016-04-17 ENCOUNTER — Other Ambulatory Visit: Payer: Managed Care, Other (non HMO) | Admitting: Urology

## 2016-04-17 ENCOUNTER — Ambulatory Visit (INDEPENDENT_AMBULATORY_CARE_PROVIDER_SITE_OTHER): Payer: Managed Care, Other (non HMO) | Admitting: Urology

## 2016-04-17 VITALS — BP 136/80 | HR 85 | Ht 62.0 in | Wt 192.0 lb

## 2016-04-17 DIAGNOSIS — N200 Calculus of kidney: Secondary | ICD-10-CM

## 2016-04-17 DIAGNOSIS — R3129 Other microscopic hematuria: Secondary | ICD-10-CM | POA: Diagnosis not present

## 2016-04-17 DIAGNOSIS — R102 Pelvic and perineal pain: Secondary | ICD-10-CM

## 2016-04-17 LAB — URINALYSIS, COMPLETE
Bilirubin, UA: NEGATIVE
GLUCOSE, UA: NEGATIVE
Ketones, UA: NEGATIVE
LEUKOCYTES UA: NEGATIVE
Nitrite, UA: NEGATIVE
SPEC GRAV UA: 1.02 (ref 1.005–1.030)
Urobilinogen, Ur: 0.2 mg/dL (ref 0.2–1.0)
pH, UA: 5.5 (ref 5.0–7.5)

## 2016-04-17 LAB — MICROSCOPIC EXAMINATION

## 2016-04-17 MED ORDER — CIPROFLOXACIN HCL 500 MG PO TABS
500.0000 mg | ORAL_TABLET | Freq: Once | ORAL | Status: AC
  Administered 2016-04-17: 500 mg via ORAL

## 2016-04-17 MED ORDER — LIDOCAINE HCL 2 % EX GEL
1.0000 "application " | Freq: Once | CUTANEOUS | Status: AC
  Administered 2016-04-17: 1 via URETHRAL

## 2016-04-17 NOTE — Progress Notes (Signed)
04/17/2016 8:29 PM   Katherine Schwartz October 08, 1965 [id number]  Referring provider: Abner Greenspan, MD [address] [place], [name] [address]  Chief Complaint  Patient presents with  . Cysto    HPI: 51 year old female who presents today for follow-up from an urgent care visit with left flank pain and microscopic hematuria.   She was seen at urgent care at [facility] on 12/30/2015 at which time she was concerned about a possible urinary tract infection. Her UA at that time was negative other than for microscopic hematuria. In addition, she had some mild dull left flank pain and pelvic pain.  KUB obtained the time per report shows a possible left renal stone, otherwise was unremarkable.   Follow up CT stone Protocol on 01/12/2016 showed a 6 mm left nonobstructing midpole calculus. Otherwise, no ureteral stones were identified.  Incisional large uterine fibroid was also appreciated.  Her left-sided flank pain improved but if not completely resolved. She does have some pelvic pain and lower abdominal pain, especially with exercising.  Today, she has no urinary complaints. No gross hematuria, dysuria, or UTIs. No urgency or frequency.  She denies a personal history of kidney stones. No family history of kidney stones.  She has never smoked.  UA today shows persistent microscopic hematuria.   PMH: Past Medical History:  Diagnosis Date  . Acne   . Allergy    allergic rhinitis  . Hyperlipidemia   . Obesity     Surgical History: Past Surgical History:  Procedure Laterality Date  . CESAREAN SECTION  1994    Home Medications:  Allergies as of 04/17/2016      Reactions   Other Nausea And Vomiting   PINE NUTS IN PESTO       Medication List       Accurate as of 04/17/16  8:29 PM. Always use your most recent med list.          ORTHO TRI-CYCLEN (28) 0.18/0.215/0.25 MG-35 MCG tablet Generic drug:  Norgestimate-Ethinyl Estradiol Triphasic Take 1 tablet by  mouth daily.   simvastatin 20 MG tablet Commonly known as:  ZOCOR Take 1 tablet (20 mg total) by mouth at bedtime.   ZYRTEC-D ALLERGY & CONGESTION 5-120 MG tablet Generic drug:  cetirizine-pseudoephedrine Take 1 tablet by mouth daily.       Allergies:  Allergies  Allergen Reactions  . Other Nausea And Vomiting    PINE NUTS IN PESTO     Family History: Family History  Problem Relation Age of Onset  . Colon cancer Maternal Grandmother 45  . Hypertension Mother   . Hyperlipidemia Mother   . Cancer Maternal Aunt     cervical CA  . Cancer Paternal Aunt     breast CA  . Esophageal cancer Neg Hx   . Stomach cancer Neg Hx   . Rectal cancer Neg Hx   . Bladder Cancer Neg Hx   . Kidney cancer Neg Hx     Social History:  reports that she has never smoked. She has never used smokeless tobacco. She reports that she drinks alcohol. She reports that she does not use drugs.  ROS: Point review of systems is negative other than as per history of present illness.  Physical Exam: BP 136/80   Pulse 85   Ht 5\' 2"  (1.575 m)   Wt 192 lb (87.1 kg)   LMP 03/27/2016   BMI 35.12 kg/m   Constitutional:  Alert and oriented, No acute distress. HEENT:  [place] AT, moist mucus membranes.  Trachea midline, no masses. Cardiovascular: No clubbing, cyanosis.  Mild bilateral lower extremity edema.  Respiratory: Normal respiratory effort, no increased work of breathing. GI: Abdomen is soft, nondistended, no abdominal masses.  Obese.  Uterine fibroid palpable. GU: No CVA tenderness.  Skin: No rashes, bruises or suspicious lesions. Neurologic: Grossly intact, no focal deficits, moving all 4 extremities. Psychiatric: Normal mood and affect.  Laboratory Data: Lab Results  Component Value Date   WBC 4.5 11/20/2015   HGB 13.2 11/20/2015   HCT 39.0 11/20/2015   MCV 95.5 11/20/2015   PLT 238.0 11/20/2015    Lab Results  Component Value Date   CREATININE 0.72 11/20/2015   Urinalysis Results for  orders placed or performed in visit on 04/17/16  Microscopic Examination  Result Value Ref Range   WBC, UA 0-5 0 - 5 /hpf   RBC, UA 11-30 (A) 0 - 2 /hpf   Epithelial Cells (non renal) 0-10 0 - 10 /hpf   Mucus, UA Present (A) Not Estab.   Bacteria, UA Few (A) None seen/Few  Urinalysis, Complete  Result Value Ref Range   Specific Gravity, UA 1.020 1.005 - 1.030   pH, UA 5.5 5.0 - 7.5   Color, UA Yellow Yellow   Appearance Ur Clear Clear   Leukocytes, UA Negative Negative   Protein, UA Trace (A) Negative/Trace   Glucose, UA Negative Negative   Ketones, UA Negative Negative   RBC, UA 3+ (A) Negative   Bilirubin, UA Negative Negative   Urobilinogen, Ur 0.2 0.2 - 1.0 mg/dL   Nitrite, UA Negative Negative   Microscopic Examination See below:     Pertinent Imaging: CLINICAL DATA:  Kidney stones.  EXAM: ABDOMEN - 1 VIEW  COMPARISON:  CT scan of January 12, 2016.  FINDINGS: The bowel gas pattern is normal. Phleboliths are noted in the pelvis. Large left renal calculus is noted which corresponds to finding on CT scan.  IMPRESSION: Left renal calculus.  No evidence of bowel obstruction or ileus.   Electronically Signed   By: Marijo Conception, M.D.   On: 04/17/2016 09:42    KUB/ previous CT stone protocol personally reviewed today  Assessment & Plan:    1. Hematuria, microscopic Most likely related to stone Recommend cysto to complete work up She has no upper tract imaging although it low risk given absence of smoking history. I explained that this means that there is a risk of a missed upper tract urothelial tumor but overall, the risk is quite low. She would prefer to defer any further imaging at this time understanding this. - Urinalysis, Complete - ciprofloxacin (CIPRO) tablet 500 mg; Take 1 tablet (500 mg total) by mouth once. - lidocaine (XYLOCAINE) 2 % jelly 1 application; Place 1 application into the urethra once.  2. Kidney stones We discussed various  treatment options including ESWL vs. ureteroscopy, laser lithotripsy, and stent vs continued observation. We discussed the risks and benefits of both including bleeding, infection, damage to surrounding structures, efficacy with need for possible further intervention, and need for temporary ureteral stent.  She is most Distended and continue to follow this given that she is asymptomatic.  We discussed general stone prevention techniques including drinking plenty water with goal of producing 2.5 L urine daily, increased citric acid intake, avoidance of high oxalate containing foods, and decreased salt intake.  Information about dietary recommendations given today.  Recommend KUB in 1 year to ensure stone size stable  3. Pelvic pain in female Large uterine fibroid appreciated on CT scan and uterine impression on cystoscopy Given the nature of her pelvic pain, may be related to large fibroid Offered referral to OB/GYN and chooses to discuss this further with her PCP at her annual   Return in about 1 year (around 04/17/2017) for KUB.  Hollice Espy, MD  [facility] [address] [place], [place] [address] [phone]

## 2016-04-17 NOTE — Progress Notes (Signed)
   04/17/16  CC:  Chief Complaint  Patient presents with  . Cysto    HPI: See note from today  Blood pressure 136/80, pulse 85, height 5\' 2"  (1.575 m), weight 192 lb (87.1 kg), last menstrual period 03/27/2016. NED. A&Ox3.   No respiratory distress   Abd soft, NT, ND Normal external genitalia with patent urethral meatus  Cystoscopy Procedure Note  Patient identification was confirmed, informed consent was obtained, and patient was prepped using Betadine solution.  Lidocaine jelly was administered per urethral meatus.    Preoperative abx where received prior to procedure.    Procedure: - Flexible cystoscope introduced, without any difficulty.   - Thorough search of the bladder revealed:    normal urethral meatus    normal urothelium    no stones    no ulcers     no tumors    no urethral polyps    no trabeculation  Posterior wall uterine impression appreciated  - Ureteral orifices were normal in position and appearance.  Post-Procedure: - Patient tolerated the procedure well   Hollice Espy, MD

## 2016-05-09 ENCOUNTER — Ambulatory Visit
Admission: RE | Admit: 2016-05-09 | Discharge: 2016-05-09 | Disposition: A | Discharge status: Home / Self Care | Payer: Managed Care, Other (non HMO) | Source: Ambulatory Visit | Attending: Family Medicine | Admitting: Family Medicine

## 2016-05-09 DIAGNOSIS — Z1231 Encounter for screening mammogram for malignant neoplasm of breast: Secondary | ICD-10-CM

## 2016-05-10 ENCOUNTER — Encounter: Payer: Self-pay | Admitting: *Deleted

## 2016-10-26 ENCOUNTER — Other Ambulatory Visit: Payer: Self-pay | Admitting: Family Medicine

## 2016-11-18 ENCOUNTER — Telehealth: Payer: Self-pay | Admitting: Family Medicine

## 2016-11-18 DIAGNOSIS — Z0000 Encounter for general adult medical examination without abnormal findings: Secondary | ICD-10-CM

## 2016-11-18 DIAGNOSIS — E7800 Pure hypercholesterolemia, unspecified: Secondary | ICD-10-CM

## 2016-11-18 NOTE — Telephone Encounter (Signed)
-----   Message from Ellamae Sia sent at 11/18/2016 11:18 AM EDT ----- Regarding: Lab orders for Tuesday, 10.30.18 Patient is scheduled for CPX labs, please order future labs, Thanks , Karna Christmas

## 2016-11-26 ENCOUNTER — Other Ambulatory Visit (INDEPENDENT_AMBULATORY_CARE_PROVIDER_SITE_OTHER): Payer: Managed Care, Other (non HMO)

## 2016-11-26 DIAGNOSIS — Z0000 Encounter for general adult medical examination without abnormal findings: Secondary | ICD-10-CM

## 2016-11-26 DIAGNOSIS — E7800 Pure hypercholesterolemia, unspecified: Secondary | ICD-10-CM | POA: Diagnosis not present

## 2016-11-26 LAB — COMPREHENSIVE METABOLIC PANEL
ALT: 16 U/L (ref 0–35)
AST: 17 U/L (ref 0–37)
Albumin: 3.7 g/dL (ref 3.5–5.2)
Alkaline Phosphatase: 41 U/L (ref 39–117)
BUN: 7 mg/dL (ref 6–23)
CO2: 26 meq/L (ref 19–32)
Calcium: 9.2 mg/dL (ref 8.4–10.5)
Chloride: 107 mEq/L (ref 96–112)
Creatinine, Ser: 0.64 mg/dL (ref 0.40–1.20)
GFR: 124.78 mL/min (ref >60.00)
GLUCOSE: 75 mg/dL (ref 70–99)
POTASSIUM: 4 meq/L (ref 3.5–5.1)
SODIUM: 140 meq/L (ref 135–145)
Total Bilirubin: 0.6 mg/dL (ref 0.2–1.2)
Total Protein: 6.6 g/dL (ref 6.0–8.3)

## 2016-11-26 LAB — LIPID PANEL
CHOL/HDL RATIO: 3
Cholesterol: 169 mg/dL (ref 0–200)
HDL: 63.3 mg/dL (ref >39.00)
LDL Cholesterol: 80 mg/dL (ref 0–99)
NonHDL: 105.25
Triglycerides: 125 mg/dL (ref 0.0–149.0)
VLDL: 25 mg/dL (ref 0.0–40.0)

## 2016-11-26 LAB — CBC WITH DIFFERENTIAL/PLATELET
BASOS ABS: 0.1 10*3/uL (ref 0.0–0.1)
Basophils Relative: 1.2 % (ref 0.0–3.0)
Eosinophils Absolute: 0.1 10*3/uL (ref 0.0–0.7)
Eosinophils Relative: 2.6 % (ref 0.0–5.0)
HCT: 37.5 % (ref 36.0–46.0)
Hemoglobin: 12.6 g/dL (ref 12.0–15.0)
LYMPHS ABS: 1.3 10*3/uL (ref 0.7–4.0)
Lymphocytes Relative: 31.2 % (ref 12.0–46.0)
MCHC: 33.6 g/dL (ref 30.0–36.0)
MCV: 95.8 fl (ref 78.0–100.0)
MONO ABS: 0.4 10*3/uL (ref 0.1–1.0)
Monocytes Relative: 9.7 % (ref 3.0–12.0)
NEUTROS ABS: 2.4 10*3/uL (ref 1.4–7.7)
NEUTROS PCT: 55.3 % (ref 43.0–77.0)
PLATELETS: 233 10*3/uL (ref 150.0–400.0)
RBC: 3.91 Mil/uL (ref 3.87–5.11)
RDW: 12.4 % (ref 11.5–15.5)
WBC: 4.3 10*3/uL (ref 4.0–10.5)

## 2016-11-26 LAB — TSH: TSH: 1.85 u[IU]/mL (ref 0.35–4.50)

## 2016-12-03 ENCOUNTER — Encounter: Payer: Self-pay | Admitting: Family Medicine

## 2016-12-03 ENCOUNTER — Ambulatory Visit (INDEPENDENT_AMBULATORY_CARE_PROVIDER_SITE_OTHER): Payer: Managed Care, Other (non HMO) | Admitting: Family Medicine

## 2016-12-03 VITALS — BP 130/92 | HR 79 | Temp 98.1°F | Ht 62.0 in | Wt 192.2 lb

## 2016-12-03 DIAGNOSIS — D259 Leiomyoma of uterus, unspecified: Secondary | ICD-10-CM | POA: Insufficient documentation

## 2016-12-03 DIAGNOSIS — E6609 Other obesity due to excess calories: Secondary | ICD-10-CM | POA: Diagnosis not present

## 2016-12-03 DIAGNOSIS — Z113 Encounter for screening for infections with a predominantly sexual mode of transmission: Secondary | ICD-10-CM

## 2016-12-03 DIAGNOSIS — E7800 Pure hypercholesterolemia, unspecified: Secondary | ICD-10-CM

## 2016-12-03 DIAGNOSIS — Z0000 Encounter for general adult medical examination without abnormal findings: Secondary | ICD-10-CM | POA: Diagnosis not present

## 2016-12-03 DIAGNOSIS — Z6834 Body mass index (BMI) 34.0-34.9, adult: Secondary | ICD-10-CM | POA: Diagnosis not present

## 2016-12-03 MED ORDER — SIMVASTATIN 20 MG PO TABS: 20.0000 mg | ORAL_TABLET | Freq: Every day | ORAL | 3 refills | Status: DC

## 2016-12-03 MED ORDER — ORTHO TRI-CYCLEN (28) 0.18/0.215/0.25 MG-35 MCG PO TABS: 1.0000 | ORAL_TABLET | Freq: Every day | ORAL | 3 refills | Status: DC

## 2016-12-03 NOTE — Assessment & Plan Note (Signed)
With new partner Uses condoms  Pt also on OC  Screen for gc/chlam/rprp and HIV today No symptoms

## 2016-12-03 NOTE — Assessment & Plan Note (Signed)
Disc goals for lipids and reasons to control them Rev labs with pt Rev low sat fat diet in detail   

## 2016-12-03 NOTE — Patient Instructions (Addendum)
Keep up with the plant based diet (getting enough protien)  Cholesterol is improved  Also keep up the exercise   Lab for STD screen today   Labs look good  Keep up the good work   We will refer you to gyn for the fibroid

## 2016-12-03 NOTE — Progress Notes (Signed)
Subjective:    Patient ID: Katherine Schwartz, female    DOB: Oct 03, 1965, 51 y.o.   MRN: [id number]  HPI Here for health maintenance exam and to review chronic medical problems    Doing well overall   Passed kidney stone this year - US revealed a fibroid tumor    Wt Readings from Last 3 Encounters:  12/03/16 192 lb 4 oz (87.2 kg)  04/17/16 192 lb (87.1 kg)  01/04/16 189 lb 8 oz (86 kg)   35.16 kg/m  Mammogram 4/18 Self breast exam   Pap 5/16 neg  Diagnosed with fibroid - wants a referral  She feels pulling from it occ  Periods are still coming - 3-5 days / sometimes heavy  Better after switch to a plant based diet  Would like to get set up with gyn   Colonoscopy 9/16  10 year recall   Tetanus shot 10/17  Had her flu shot   Has had hep C screening   Hyperlipidemia Lab Results  Component Value Date   CHOL 169 11/26/2016   CHOL 224 (H) 11/20/2015   CHOL 191 06/01/2014   Lab Results  Component Value Date   HDL 63.30 11/26/2016   HDL 61.20 11/20/2015   HDL 59.40 06/01/2014   Lab Results  Component Value Date   LDLCALC 80 11/26/2016   LDLCALC 135 (H) 11/20/2015   LDLCALC 107 (H) 06/01/2014   Lab Results  Component Value Date   TRIG 125.0 11/26/2016   TRIG 139.0 11/20/2015   TRIG 122.0 06/01/2014   Lab Results  Component Value Date   CHOLHDL 3 11/26/2016   CHOLHDL 4 11/20/2015   CHOLHDL 3 06/01/2014   Lab Results  Component Value Date   LDLDIRECT 176.2 09/22/2008   LDLDIRECT 141.6 06/17/2008   LDLDIRECT 131.2 12/08/2007   On simvastatin and diet  Plant based diet has really helped!  Also exercising gym and yoga   Other labs: Results for orders placed or performed in visit on 11/26/16  CBC with Differential/Platelet  Result Value Ref Range   WBC 4.3 4.0 - 10.5 K/uL   RBC 3.91 3.87 - 5.11 Mil/uL   Hemoglobin 12.6 12.0 - 15.0 g/dL   HCT 37.5 36.0 - 46.0 %   MCV 95.8 78.0 - 100.0 fl   MCHC 33.6 30.0 - 36.0 g/dL   RDW 12.4 11.5 - 15.5 %   Platelets 233.0 150.0 - 400.0 K/uL   Neutrophils Relative % 55.3 43.0 - 77.0 %   Lymphocytes Relative 31.2 12.0 - 46.0 %   Monocytes Relative 9.7 3.0 - 12.0 %   Eosinophils Relative 2.6 0.0 - 5.0 %   Basophils Relative 1.2 0.0 - 3.0 %   Neutro Abs 2.4 1.4 - 7.7 K/uL   Lymphs Abs 1.3 0.7 - 4.0 K/uL   Monocytes Absolute 0.4 0.1 - 1.0 K/uL   Eosinophils Absolute 0.1 0.0 - 0.7 K/uL   Basophils Absolute 0.1 0.0 - 0.1 K/uL  Comprehensive metabolic panel  Result Value Ref Range   Sodium 140 135 - 145 mEq/L   Potassium 4.0 3.5 - 5.1 mEq/L   Chloride 107 96 - 112 mEq/L   CO2 26 19 - 32 mEq/L   Glucose, Bld 75 70 - 99 mg/dL   BUN 7 6 - 23 mg/dL   Creatinine, Ser 0.64 0.40 - 1.20 mg/dL   Total Bilirubin 0.6 0.2 - 1.2 mg/dL   Alkaline Phosphatase 41 39 - 117 U/L   AST 17 0 - 37  U/L   ALT 16 0 - 35 U/L   Total Protein 6.6 6.0 - 8.3 g/dL   Albumin 3.7 3.5 - 5.2 g/dL   Calcium 9.2 8.4 - 10.5 mg/dL   GFR 124.78 >60.00 mL/min  Lipid panel  Result Value Ref Range   Cholesterol 169 0 - 200 mg/dL   Triglycerides 125.0 0.0 - 149.0 mg/dL   HDL 63.30 >39.00 mg/dL   VLDL 25.0 0.0 - 40.0 mg/dL   LDL Cholesterol 80 0 - 99 mg/dL   Total CHOL/HDL Ratio 3    NonHDL 105.25   TSH  Result Value Ref Range   TSH 1.85 0.35 - 4.50 uIU/mL    In a new relationship -would like std screening  No symtoms   Patient Active Problem List   Diagnosis Date Noted  . Uterine fibroid 12/03/2016  . Screen for STD (sexually transmitted disease) 12/03/2016  . Special screening for malignant neoplasms, colon 06/07/2014  . Encounter for repeat Pap smear due to previous insufficient cervical cells 08/14/2012  . Encounter for routine gynecological examination 06/28/2011  . Routine general medical examination at a health care facility 06/26/2010  . Obesity 08/03/2007  . Hyperlipidemia, unspecified 05/01/2007  . ALLERGIC RHINITIS 05/01/2007  . 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 Acne    . Allergy    allergic rhinitis  . Hyperlipidemia   . Obesity    Past Surgical History:  Procedure Laterality Date  . CESAREAN SECTION  1994   Social History   Tobacco Use  . Smoking status: Never Smoker  . Smokeless tobacco: Never Used  Substance Use Topics  . Alcohol use: Yes    Alcohol/week: 0.0 oz    Comment: socially, rare  . Drug use: No   Family History  Problem Relation Age of Onset  . Colon cancer Maternal Grandmother 38  . Hypertension Mother   . Hyperlipidemia Mother   . Cancer Maternal Aunt        cervical CA  . Cancer Paternal Aunt        breast CA  . Esophageal cancer Neg Hx   . Stomach cancer Neg Hx   . Rectal cancer Neg Hx   . Bladder Cancer Neg Hx   . Kidney cancer Neg Hx    Allergies  Allergen Reactions  . Other Nausea And Vomiting    PINE NUTS IN PESTO    Current Outpatient Medications on File Prior to Visit  Medication Sig Dispense Refill  . cetirizine-pseudoephedrine (ZYRTEC-D ALLERGY & CONGESTION) 5-120 MG per tablet Take 1 tablet by mouth daily.       No current facility-administered medications on file prior to visit.     Review of Systems  Constitutional: Negative for activity change, appetite change, fatigue, fever and unexpected weight change.  HENT: Negative for congestion, ear pain, rhinorrhea, sinus pressure and sore throat.   Eyes: Negative for pain, redness and visual disturbance.  Respiratory: Negative for cough, shortness of breath and wheezing.   Cardiovascular: Negative for chest pain and palpitations.  Gastrointestinal: Negative for abdominal pain, blood in stool, constipation and diarrhea.  Endocrine: Negative for polydipsia and polyuria.  Genitourinary: Negative for dysuria, frequency and urgency.  Musculoskeletal: Negative for arthralgias, back pain and myalgias.  Skin: Negative for pallor and rash.  Allergic/Immunologic: Negative for environmental allergies.  Neurological: Negative for dizziness, syncope and  headaches.  Hematological: Negative for adenopathy. Does not bruise/bleed easily.  Psychiatric/Behavioral: Negative for decreased concentration and dysphoric  mood. The patient is not nervous/anxious.        Objective:   Physical Exam  Constitutional: She appears well-developed and well-nourished. No distress.  obese and well appearing   HENT:  Head: Normocephalic and atraumatic.  Right Ear: External ear normal.  Left Ear: External ear normal.  Mouth/Throat: Oropharynx is clear and moist.  Eyes: Conjunctivae and EOM are normal. Pupils are equal, round, and reactive to light. No scleral icterus.  Neck: Normal range of motion. Neck supple. No JVD present. Carotid bruit is not present. No thyromegaly present.  Cardiovascular: Normal rate, regular rhythm, normal heart sounds and intact distal pulses. Exam reveals no gallop.  Pulmonary/Chest: Effort normal and breath sounds normal. No respiratory distress. She has no wheezes. She exhibits no tenderness.  Abdominal: Soft. Bowel sounds are normal. She exhibits no distension, no abdominal bruit and no mass. There is no tenderness.  Genitourinary: No breast swelling, tenderness, discharge or bleeding.  Genitourinary Comments: Breast exam: No mass, nodules, thickening, tenderness, bulging, retraction, inflamation, nipple discharge or skin changes noted.  No axillary or clavicular LA.      Musculoskeletal: Normal range of motion. She exhibits no edema or tenderness.  Lymphadenopathy:    She has no cervical adenopathy.  Neurological: She is alert. She has normal reflexes. No cranial nerve deficit. She exhibits normal muscle tone. Coordination normal.  Skin: Skin is warm and dry. No rash noted. No erythema. No pallor.  Solar lentigines diffusely   Psychiatric: She has a normal mood and affect.          Assessment & Plan:   Problem List Items Addressed This Visit      Genitourinary   Uterine fibroid    Ref to gyn for eval      Relevant  Orders   Ambulatory referral to Gynecology     Other   Hyperlipidemia, unspecified    Disc goals for lipids and reasons to control them Rev labs with pt Rev low sat fat diet in detail       Relevant Medications   simvastatin (ZOCOR) 20 MG tablet   Obesity    Discussed how this problem influences overall health and the risks it imposes  Reviewed plan for weight loss with lower calorie diet (via better food choices and also portion control or program like weight watchers) and exercise building up to or more than 30 minutes 5 days per week including some aerobic activity         Routine general medical examination at a health care facility - Primary    Reviewed health habits including diet and exercise and skin cancer prevention Reviewed appropriate screening tests for age  Also reviewed health mt list, fam hx and immunization status , as well as social and family history   See Hpi Labs rev  Ref to gyn for fibroid tumor  STD screen today       Screen for STD (sexually transmitted disease)    With new partner Uses condoms  Pt also on OC  Screen for gc/chlam/rprp and HIV today No symptoms       Relevant Orders   HIV antibody   RPR   C. trachomatis/N. gonorrhoeae RNA

## 2016-12-03 NOTE — Assessment & Plan Note (Signed)
Ref to gyn for eval

## 2016-12-03 NOTE — Assessment & Plan Note (Signed)
Discussed how this problem influences overall health and the risks it imposes  Reviewed plan for weight loss with lower calorie diet (via better food choices and also portion control or program like weight watchers) and exercise building up to or more than 30 minutes 5 days per week including some aerobic activity    

## 2016-12-03 NOTE — Assessment & Plan Note (Signed)
Reviewed health habits including diet and exercise and skin cancer prevention Reviewed appropriate screening tests for age  Also reviewed health mt list, fam hx and immunization status , as well as social and family history   See Hpi Labs rev  Ref to gyn for fibroid tumor  STD screen today

## 2016-12-04 LAB — C. TRACHOMATIS/N. GONORRHOEAE RNA
C. trachomatis RNA, TMA: NOT DETECTED
N. gonorrhoeae RNA, TMA: NOT DETECTED

## 2016-12-04 LAB — HIV ANTIBODY (ROUTINE TESTING W REFLEX): HIV 1&2 Ab, 4th Generation: NONREACTIVE

## 2016-12-04 LAB — RPR: RPR Ser Ql: NONREACTIVE

## 2016-12-09 ENCOUNTER — Encounter: Payer: Self-pay | Admitting: *Deleted

## 2016-12-26 ENCOUNTER — Encounter: Payer: Self-pay | Admitting: Obstetrics & Gynecology

## 2016-12-26 ENCOUNTER — Ambulatory Visit (INDEPENDENT_AMBULATORY_CARE_PROVIDER_SITE_OTHER): Payer: Managed Care, Other (non HMO) | Admitting: Obstetrics & Gynecology

## 2016-12-26 VITALS — BP 138/83 | HR 90 | Resp 16 | Ht 62.0 in | Wt 193.0 lb

## 2016-12-26 DIAGNOSIS — D219 Benign neoplasm of connective and other soft tissue, unspecified: Secondary | ICD-10-CM | POA: Diagnosis not present

## 2016-12-26 DIAGNOSIS — Z1151 Encounter for screening for human papillomavirus (HPV): Secondary | ICD-10-CM

## 2016-12-26 DIAGNOSIS — Z124 Encounter for screening for malignant neoplasm of cervix: Secondary | ICD-10-CM | POA: Diagnosis not present

## 2016-12-26 DIAGNOSIS — N951 Menopausal and female climacteric states: Secondary | ICD-10-CM | POA: Diagnosis not present

## 2016-12-26 DIAGNOSIS — Z0000 Encounter for general adult medical examination without abnormal findings: Secondary | ICD-10-CM

## 2016-12-26 NOTE — Progress Notes (Signed)
Last pap 05/2014 - Desires pap today Desires BRCA testing  She is being referred to [facility] d/t Renal CT result - Large right fundal uterine fibroid seen measuring 8.5 cm. No adnexal mass or free fluid identified.

## 2016-12-26 NOTE — Progress Notes (Signed)
Patient ID: Katherine Schwartz, female   DOB: 1965/03/05, 51 y.o.   MRN: [id number]  Chief Complaint  Patient presents with  . Gynecologic Exam    HPI Katherine Schwartz is a 51 y.o. female.  Divorced P52 (28 yo daughter) referred here for the finding of a 8.5 cm fibroid seen during a CT for her kidney stones.  She has monthly periods, lasting 3-5 days, somewhat heavy, no major pain. She takes OCPs for contraception.   She has family history of breast cancer in paternal aunt (deceased), +ovarian cancer in mat aunt, + colon cancer in maternal GM.   HPI  Past Medical History:  Diagnosis Date  . Acne   . Allergy    allergic rhinitis  . Hyperlipidemia   . Obesity     Past Surgical History:  Procedure Laterality Date  . CESAREAN SECTION  1994    Family History  Problem Relation Age of Onset  . Colon cancer Maternal Grandmother 22  . Hypertension Mother   . Hyperlipidemia Mother   . Cancer Maternal Aunt        cervical/ovarian CA  . Cancer Paternal Aunt        breast CA  . Esophageal cancer Neg Hx   . Stomach cancer Neg Hx   . Rectal cancer Neg Hx   . Bladder Cancer Neg Hx   . Kidney cancer Neg Hx     Social History Social History   Tobacco Use  . Smoking status: Never Smoker  . Smokeless tobacco: Never Used  Substance Use Topics  . Alcohol use: Yes    Alcohol/week: 0.0 oz    Comment: socially, rare  . Drug use: No    Allergies  Allergen Reactions  . Other Nausea And Vomiting    PINE NUTS IN PESTO     Current Outpatient Medications  Medication Sig Dispense Refill  . cetirizine-pseudoephedrine (ZYRTEC-D ALLERGY & CONGESTION) 5-120 MG per tablet Take 1 tablet by mouth daily.      . ORTHO TRI-CYCLEN, 28, 0.18/0.215/0.25 MG-35 MCG tablet Take 1 tablet daily by mouth. 84 tablet 3  . simvastatin (ZOCOR) 20 MG tablet Take 1 tablet (20 mg total) at bedtime by mouth. 90 tablet 3   No current facility-administered medications for this visit.     Review of  Systems Review of Systems She has had a flu vaccine and her mammogram is up to date She has had a c/s Blood pressure 138/83, pulse 90, resp. rate 16, height 5\' 2"  (1.575 m), weight 193 lb (87.5 kg).  Physical Exam Physical Exam Breathing, conversing, and ambulating normally Abd- benign, uterus palpable up to U-1 Cervix very high up, long spec required Data Reviewed IMPRESSION: 6 mm nonobstructive left renal calculus. No evidence of ureteral calculi or hydronephrosis.  Large right fundal uterine fibroid.   Assessment Age 67 with continued periods, on OCPs  fibroid  Plan    Check [place] on day 7 of pill free time   schedule gyn ultrasound My Risk testing    Emily Filbert 12/26/2016, 9:40 AM

## 2016-12-31 LAB — CYTOLOGY - PAP
ADEQUACY: ABSENT
Diagnosis: NEGATIVE
HPV: NOT DETECTED

## 2017-01-02 ENCOUNTER — Ambulatory Visit: Payer: Managed Care, Other (non HMO)

## 2017-01-16 ENCOUNTER — Other Ambulatory Visit: Payer: Managed Care, Other (non HMO)

## 2017-01-17 ENCOUNTER — Telehealth: Payer: Self-pay | Admitting: *Deleted

## 2017-01-17 LAB — FOLLICLE STIMULATING HORMONE: FSH: 17.7 m[IU]/mL

## 2017-01-17 NOTE — Telephone Encounter (Signed)
-----   Message from Emily Filbert, MD sent at 01/17/2017 10:29 AM EST ----- If that [facility] was drawn on day #7 of pill FREE time as it was ordered, then she is PRE menopausal and should continue the birthcontrol pills for contraception. Thanks

## 2017-01-17 NOTE — Telephone Encounter (Signed)
LM for pt to call back to go over lab results.  

## 2017-02-03 ENCOUNTER — Telehealth: Payer: Self-pay

## 2017-02-03 NOTE — Telephone Encounter (Signed)
-----   Message from Emily Filbert, MD sent at 01/31/2017  3:45 PM EST ----- Yes. The test will only be valid if she has NOT been taking her birth control pills for 7 days prior to the blood draw. ----- Message ----- From: Cindie Crumbly, RMA Sent: 01/29/2017   9:52 AM To: Emily Filbert, MD  Good Morning,   Patient did not stop birth control pills prior to lab been drawn. She only stop for 5 days. Do you want her to have labs drawn again? She has not started her new pack of pills either. Please see lab results.   ----- Message ----- From: Gretchen Short, CMA Sent: 01/17/2017  10:34 AM To: Cindie Crumbly, RMA  I left a message for this patient to call back and go over lab results. If she calls after I am gone, please verify she had stopped her birth control pills for 7 days before she had this drawn.  If she did stop them then she is PreMenopausal and needs to continue taking her pills.   Thanks! [place] ##I'm off next week :) ## ----- Message ----- From: Emily Filbert, MD Sent: 01/17/2017  10:29 AM To: Gretchen Short, CMA  If that [facility] was drawn on day #7 of pill FREE time as it was ordered, then she is PRE menopausal and should continue the birthcontrol pills for contraception. Thanks

## 2017-02-03 NOTE — Telephone Encounter (Signed)
Called patient- no answer I have left a message for patient to call us back regarding Dr.Dove recommendations.

## 2017-02-05 ENCOUNTER — Other Ambulatory Visit: Payer: Managed Care, Other (non HMO)

## 2017-02-05 DIAGNOSIS — N951 Menopausal and female climacteric states: Secondary | ICD-10-CM

## 2017-02-05 NOTE — Addendum Note (Signed)
Addended by: Phillip Heal, Johntay Doolen A on: 02/05/2017 08:35 AM   Modules accepted: Orders

## 2017-02-06 ENCOUNTER — Ambulatory Visit
Admission: RE | Admit: 2017-02-06 | Discharge: 2017-02-06 | Disposition: A | Discharge status: Home / Self Care | Payer: Managed Care, Other (non HMO) | Source: Ambulatory Visit | Attending: Obstetrics & Gynecology | Admitting: Obstetrics & Gynecology

## 2017-02-06 DIAGNOSIS — D219 Benign neoplasm of connective and other soft tissue, unspecified: Secondary | ICD-10-CM

## 2017-02-06 DIAGNOSIS — D259 Leiomyoma of uterus, unspecified: Secondary | ICD-10-CM | POA: Diagnosis not present

## 2017-02-06 LAB — FOLLICLE STIMULATING HORMONE: FSH: 16.3 m[IU]/mL

## 2017-03-24 ENCOUNTER — Telehealth: Payer: Self-pay

## 2017-03-24 NOTE — Telephone Encounter (Signed)
She is requesting ultrasound results and labs results. Recommedation?

## 2017-03-24 NOTE — Telephone Encounter (Signed)
Call patient concerning her lab results. Patient voice understanding at this time.

## 2017-03-24 NOTE — Telephone Encounter (Signed)
-----   Message from Emily Filbert, MD sent at 03/24/2017  3:30 PM EST ----- Please let her know that her [facility] shows that she is NOT in menopause so having a period every month is Normal. Her fibroid is noted on the ultrasound but as long as it is not bothering her, we can leave it alone. If she has more questions, I would suggest an appt to discuss things.

## 2017-03-24 NOTE — Telephone Encounter (Signed)
-----   Message from Emily Filbert, MD sent at 03/24/2017  3:30 PM EST ----- Please let her know that her [facility] shows that she is NOT in menopause so having a period every month is Normal. Her fibroid is noted on the ultrasound but as long as it is not bothering her, we can leave it alone. If she has more questions, I would suggest an appt to discuss things.

## 2017-03-24 NOTE — Telephone Encounter (Signed)
Call patient regarding Dr.Dove recommendation.

## 2017-03-31 ENCOUNTER — Other Ambulatory Visit: Payer: Self-pay | Admitting: Family Medicine

## 2017-03-31 DIAGNOSIS — Z1231 Encounter for screening mammogram for malignant neoplasm of breast: Secondary | ICD-10-CM

## 2017-04-18 ENCOUNTER — Ambulatory Visit
Admission: RE | Admit: 2017-04-18 | Discharge: 2017-04-18 | Disposition: A | Discharge status: Home / Self Care | Payer: Managed Care, Other (non HMO) | Source: Ambulatory Visit | Attending: Urology | Admitting: Urology

## 2017-04-18 ENCOUNTER — Encounter: Payer: Self-pay | Admitting: Urology

## 2017-04-18 ENCOUNTER — Ambulatory Visit: Payer: Managed Care, Other (non HMO) | Admitting: Urology

## 2017-04-18 VITALS — BP 152/85 | HR 94 | Ht 62.0 in | Wt 190.0 lb

## 2017-04-18 DIAGNOSIS — N200 Calculus of kidney: Secondary | ICD-10-CM | POA: Diagnosis not present

## 2017-04-18 NOTE — Progress Notes (Signed)
04/18/2017 3:18 PM   Katherine Schwartz 1965/12/03 [id number]  Referring provider: Abner Greenspan, MD [address] [place], [place] [address]  Chief Complaint  Patient presents with  . Follow-up    1year    HPI: 52 year old female with a history of nonobstructing nephrolithiasis and microscopic hematuria today for annual follow-up.  She initially underwent CT stone protocol on 12/2015 indicating 6 mm nonobstructing left midpole stone for left flank pain and microscopic hematuria.  She underwent negative cysto in 03/2016.  Given her lack of risk factors including no history of smoking, she elected to not proceed with any further imaging in the form of CT urogram in the setting of presumed cause for her microscopic blood.  On the scan, she was noted to have a large right fundal uterine fibroid.  She is followed by OB/GYN.  Today, she denies any flank pain.  No stone episodes over the past year.  No urinary symptoms including urgency, frequency, gross hematuria, or dysuria.  No UTIs.  KUB today shows essentially stable left midpole stone.  She is trying to drink plenty of water but admits to having a hard time with this.  She drinks lemon juice.   PMH: Past Medical History:  Diagnosis Date  . Acne   . Allergy    allergic rhinitis  . Hyperlipidemia   . Obesity     Surgical History: Past Surgical History:  Procedure Laterality Date  . CESAREAN SECTION  1994    Home Medications:  Allergies as of 04/18/2017      Reactions   Other Nausea And Vomiting   PINE NUTS IN PESTO       Medication List        Accurate as of 04/18/17 11:59 PM. Always use your most recent med list.          ORTHO TRI-CYCLEN (28) 0.18/0.215/0.25 MG-35 MCG tablet Generic drug:  Norgestimate-Ethinyl Estradiol Triphasic Take 1 tablet daily by mouth.   simvastatin 20 MG tablet Commonly known as:  ZOCOR Take 1 tablet (20 mg total) at bedtime by mouth.   ZYRTEC-D ALLERGY & CONGESTION  5-120 MG tablet Generic drug:  cetirizine-pseudoephedrine Take 1 tablet by mouth daily.       Allergies:  Allergies  Allergen Reactions  . Other Nausea And Vomiting    PINE NUTS IN PESTO     Family History: Family History  Problem Relation Age of Onset  . Colon cancer Maternal Grandmother 54  . Hypertension Mother   . Hyperlipidemia Mother   . Cancer Maternal Aunt        cervical/ovarian CA  . Cancer Paternal Aunt        breast CA  . Esophageal cancer Neg Hx   . Stomach cancer Neg Hx   . Rectal cancer Neg Hx   . Bladder Cancer Neg Hx   . Kidney cancer Neg Hx     Social History:  reports that she has never smoked. She has never used smokeless tobacco. She reports that she drinks alcohol. She reports that she does not use drugs.  ROS: UROLOGY Frequent Urination?: No Hard to postpone urination?: No Burning/pain with urination?: No Get up at night to urinate?: No Leakage of urine?: No Urine stream starts and stops?: No Trouble starting stream?: No Do you have to strain to urinate?: No Blood in urine?: No Urinary tract infection?: No Sexually transmitted disease?: No Painful intercourse?: No Weak stream?: No Currently pregnant?: No Vaginal bleeding?: No Last  menstrual period?: n  Gastrointestinal Nausea?: No Vomiting?: No Indigestion/heartburn?: No Diarrhea?: No Constipation?: No  Constitutional Fever: No Night sweats?: No Weight loss?: No Fatigue?: No  Skin Skin rash/lesions?: No Itching?: No  Eyes Blurred vision?: No Double vision?: No  Ears/Nose/Throat Sore throat?: No Sinus problems?: Yes  Hematologic/Lymphatic Swollen glands?: No Easy bruising?: No  Cardiovascular Leg swelling?: No Chest pain?: No  Respiratory Cough?: No Shortness of breath?: No  Endocrine Excessive thirst?: Yes  Musculoskeletal Back pain?: No Joint pain?: No  Neurological Headaches?: No Dizziness?: No  Psychologic Depression?: No Anxiety?:  No  Physical Exam: BP (!) 152/85   Pulse 94   Ht 5\' 2"  (1.575 m)   Wt 190 lb (86.2 kg)   BMI 34.75 kg/m   Constitutional:  Alert and oriented, No acute distress.  Pleasant, friendly. HEENT: [place] AT, moist mucus membranes.  Trachea midline, no masses. Cardiovascular: No clubbing, cyanosis, or edema. Respiratory: Normal respiratory effort, no increased work of breathing. GI: Abdomen is soft, nontender, nondistended, no abdominal masse, obese. GU: No CVA tenderness Skin: No rashes, bruises or suspicious lesions. Neurologic: Grossly intact, no focal deficits, moving all 4 extremities. Psychiatric: Normal mood and affect.  Laboratory Data: Lab Results  Component Value Date   WBC 4.3 11/26/2016   HGB 12.6 11/26/2016   HCT 37.5 11/26/2016   MCV 95.8 11/26/2016   PLT 233.0 11/26/2016    Lab Results  Component Value Date   CREATININE 0.64 11/26/2016    Urinalysis    Component Value Date/Time   APPEARANCEUR Clear 04/17/2016 0955   GLUCOSEU Negative 04/17/2016 0955   BILIRUBINUR Negative 04/17/2016 0955   PROTEINUR Trace (A) 04/17/2016 0955   NITRITE Negative 04/17/2016 0955   LEUKOCYTESUR Negative 04/17/2016 0955    Pertinent Imaging: CLINICAL DATA:  52 year old female with kidney stone with left kidney stone.  EXAM: ABDOMEN - 1 VIEW  COMPARISON:  CT Abdomen and Pelvis 01/12/2016.  FINDINGS: Stable 8 millimeter left midpole region calculus since 2017 (arrow). Numerous bilateral pelvic phleboliths appear stable. No other urologic calculus identified. Abdominal visceral contours appear normal. Non obstructed bowel gas pattern. No osseous abnormality identified.  IMPRESSION: Stable left nephrolithiasis since 2017. No other urologic calculus identified.   Electronically Signed   By: Genevie Ann M.D.   On: 04/18/2017 16:03  KUB personally reviewed today.   Assessment & Plan:    1. Kidney stones KUB reviewed today with the patient Joaquim Lai appears  essentially stable, possibly minimally enlarged We discussed options today including continued observation for the stone, ureteroscopy, versus shockwave lithotripsy including the risks and benefits of each approach.  Given that she is asymptomatic, she would like to continue to follow the stone. She is encouraged to drink plenty of fluids and reminded of stone prevention strategies.  F/u in 2 years with UA/ KUB  Hollice Espy, MD  [facility] [address], [place], [place] [address] [phone]

## 2017-05-15 ENCOUNTER — Ambulatory Visit: Payer: Managed Care, Other (non HMO)

## 2017-05-22 ENCOUNTER — Ambulatory Visit
Admission: RE | Admit: 2017-05-22 | Discharge: 2017-05-22 | Disposition: A | Discharge status: Home / Self Care | Payer: Managed Care, Other (non HMO) | Source: Ambulatory Visit | Attending: Family Medicine | Admitting: Family Medicine

## 2017-05-22 DIAGNOSIS — Z1231 Encounter for screening mammogram for malignant neoplasm of breast: Secondary | ICD-10-CM

## 2017-12-05 ENCOUNTER — Encounter: Payer: Self-pay | Admitting: Family Medicine

## 2017-12-05 ENCOUNTER — Ambulatory Visit (INDEPENDENT_AMBULATORY_CARE_PROVIDER_SITE_OTHER): Payer: Managed Care, Other (non HMO) | Admitting: Family Medicine

## 2017-12-05 VITALS — BP 130/82 | HR 85 | Temp 97.9°F | Ht 61.75 in | Wt 188.5 lb

## 2017-12-05 DIAGNOSIS — E6609 Other obesity due to excess calories: Secondary | ICD-10-CM | POA: Diagnosis not present

## 2017-12-05 DIAGNOSIS — Z0000 Encounter for general adult medical examination without abnormal findings: Secondary | ICD-10-CM | POA: Diagnosis not present

## 2017-12-05 DIAGNOSIS — E7800 Pure hypercholesterolemia, unspecified: Secondary | ICD-10-CM | POA: Diagnosis not present

## 2017-12-05 DIAGNOSIS — Z6834 Body mass index (BMI) 34.0-34.9, adult: Secondary | ICD-10-CM | POA: Diagnosis not present

## 2017-12-05 LAB — LIPID PANEL
CHOL/HDL RATIO: 4
CHOLESTEROL: 258 mg/dL — AB (ref 0–200)
HDL: 66.7 mg/dL (ref >39.00)
NonHDL: 191.24
Triglycerides: 208 mg/dL — ABNORMAL HIGH (ref 0.0–149.0)
VLDL: 41.6 mg/dL — AB (ref 0.0–40.0)

## 2017-12-05 LAB — CBC WITH DIFFERENTIAL/PLATELET
BASOS PCT: 1 % (ref 0.0–3.0)
Basophils Absolute: 0 10*3/uL (ref 0.0–0.1)
EOS PCT: 2.8 % (ref 0.0–5.0)
Eosinophils Absolute: 0.1 10*3/uL (ref 0.0–0.7)
HCT: 40.2 % (ref 36.0–46.0)
Hemoglobin: 13.5 g/dL (ref 12.0–15.0)
Lymphocytes Relative: 26.2 % (ref 12.0–46.0)
Lymphs Abs: 1.1 10*3/uL (ref 0.7–4.0)
MCHC: 33.6 g/dL (ref 30.0–36.0)
MCV: 94.1 fl (ref 78.0–100.0)
MONO ABS: 0.4 10*3/uL (ref 0.1–1.0)
Monocytes Relative: 9.7 % (ref 3.0–12.0)
NEUTROS ABS: 2.5 10*3/uL (ref 1.4–7.7)
Neutrophils Relative %: 60.3 % (ref 43.0–77.0)
PLATELETS: 236 10*3/uL (ref 150.0–400.0)
RBC: 4.27 Mil/uL (ref 3.87–5.11)
RDW: 12.8 % (ref 11.5–15.5)
WBC: 4.2 10*3/uL (ref 4.0–10.5)

## 2017-12-05 LAB — COMPREHENSIVE METABOLIC PANEL
ALBUMIN: 4.2 g/dL (ref 3.5–5.2)
ALT: 17 U/L (ref 0–35)
AST: 18 U/L (ref 0–37)
Alkaline Phosphatase: 43 U/L (ref 39–117)
BUN: 12 mg/dL (ref 6–23)
CHLORIDE: 104 meq/L (ref 96–112)
CO2: 29 mEq/L (ref 19–32)
Calcium: 9.8 mg/dL (ref 8.4–10.5)
Creatinine, Ser: 0.64 mg/dL (ref 0.40–1.20)
GFR: 124.3 mL/min (ref >60.00)
GLUCOSE: 86 mg/dL (ref 70–99)
POTASSIUM: 3.6 meq/L (ref 3.5–5.1)
SODIUM: 138 meq/L (ref 135–145)
Total Bilirubin: 0.6 mg/dL (ref 0.2–1.2)
Total Protein: 7.6 g/dL (ref 6.0–8.3)

## 2017-12-05 LAB — LDL CHOLESTEROL, DIRECT: LDL DIRECT: 172 mg/dL

## 2017-12-05 LAB — TSH: TSH: 1.37 u[IU]/mL (ref 0.35–4.50)

## 2017-12-05 MED ORDER — SIMVASTATIN 20 MG PO TABS: 20.0000 mg | ORAL_TABLET | Freq: Every day | ORAL | 3 refills | Status: DC

## 2017-12-05 MED ORDER — ORTHO TRI-CYCLEN (28) 0.18/0.215/0.25 MG-35 MCG PO TABS: 1.0000 | ORAL_TABLET | Freq: Every day | ORAL | 3 refills | Status: DC

## 2017-12-05 NOTE — Progress Notes (Signed)
Subjective:    Patient ID: Katherine Schwartz, female    DOB: January 26, 1966, 52 y.o.   MRN: [id number]  HPI Here for health maintenance exam and to review chronic medical problems     Wt Readings from Last 3 Encounters:  12/05/17 188 lb 8 oz (85.5 kg)  04/18/17 190 lb (86.2 kg)  12/26/16 193 lb (87.5 kg)  exercise- 3-4 gym days per week  Lost over 10 lb /gained some back  34.76 kg/m   Flu vaccine 8/19   Mammogram 4/19 nl Self breast exam   Pap 11/18 neg with neg HPV screen  Menses -still regular - flow depends on the month  Saw gyn last year - imaged fibroid - more cramping with cycle  FSH done after 7 d off OC-not menopausal yet  On OC (continues/not menopausal)   Colonoscopy 9/16 - 10 y recall   Tetanus vaccine 10/17 May be interested in shingrix   Zoster status -never had dz or vaccine   BP Readings from Last 3 Encounters:  12/05/17 130/82  04/18/17 (!) 152/85  12/26/16 138/83     Hyperlipidemia Lab Results  Component Value Date   CHOL 169 11/26/2016   CHOL 224 (H) 11/20/2015   CHOL 191 06/01/2014   Lab Results  Component Value Date   HDL 63.30 11/26/2016   HDL 61.20 11/20/2015   HDL 59.40 06/01/2014   Lab Results  Component Value Date   LDLCALC 80 11/26/2016   LDLCALC 135 (H) 11/20/2015   LDLCALC 107 (H) 06/01/2014   Lab Results  Component Value Date   TRIG 125.0 11/26/2016   TRIG 139.0 11/20/2015   TRIG 122.0 06/01/2014   Lab Results  Component Value Date   CHOLHDL 3 11/26/2016   CHOLHDL 4 11/20/2015   CHOLHDL 3 06/01/2014   Lab Results  Component Value Date   LDLDIRECT 176.2 09/22/2008   LDLDIRECT 141.6 06/17/2008   LDLDIRECT 131.2 12/08/2007   on simvastatin  Due for labs Diet is fairly good / she avoids fried foods and beef     Saw urology  Has large renal stone  Considering lithotripsy  Has not passed  Looking into a kidney stone diet - lemon water   Patient Active Problem List   Diagnosis Date Noted  . Uterine fibroid  12/03/2016  . Screen for STD (sexually transmitted disease) 12/03/2016  . Special screening for malignant neoplasms, colon 06/07/2014  . Encounter for repeat Pap smear due to previous insufficient cervical cells 08/14/2012  . Encounter for routine gynecological examination 06/28/2011  . Routine general medical examination at a health care facility 06/26/2010  . Obesity 08/03/2007  . Hyperlipidemia, unspecified 05/01/2007  . ALLERGIC RHINITIS 05/01/2007  . 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 Acne   . Allergy    allergic rhinitis  . Hyperlipidemia   . Obesity    Past Surgical History:  Procedure Laterality Date  . CESAREAN SECTION  1994   Social History   Tobacco Use  . Smoking status: Never Smoker  . Smokeless tobacco: Never Used  Substance Use Topics  . Alcohol use: Yes    Alcohol/week: 0.0 standard drinks    Comment: socially, rare  . Drug use: No   Family History  Problem Relation Age of Onset  . Colon cancer Maternal Grandmother 89  . Hypertension Mother   . Hyperlipidemia Mother   . Cancer Maternal Aunt        cervical/ovarian CA  .  Cancer Paternal Aunt        breast CA  . Esophageal cancer Neg Hx   . Stomach cancer Neg Hx   . Rectal cancer Neg Hx   . Bladder Cancer Neg Hx   . Kidney cancer Neg Hx   . Breast cancer Neg Hx    Allergies  Allergen Reactions  . Other Nausea And Vomiting    PINE NUTS IN PESTO    Current Outpatient Medications on File Prior to Visit  Medication Sig Dispense Refill  . cetirizine-pseudoephedrine (ZYRTEC-D ALLERGY & CONGESTION) 5-120 MG per tablet Take 1 tablet by mouth daily.       No current facility-administered medications on file prior to visit.      Review of Systems  Constitutional: Negative for activity change, appetite change, fatigue, fever and unexpected weight change.  HENT: Negative for congestion, ear pain, rhinorrhea, sinus pressure and sore throat.   Eyes: Negative for pain,  redness and visual disturbance.  Respiratory: Negative for cough, shortness of breath and wheezing.   Cardiovascular: Negative for chest pain and palpitations.  Gastrointestinal: Negative for abdominal pain, blood in stool, constipation and diarrhea.  Endocrine: Negative for polydipsia and polyuria.  Genitourinary: Negative for dysuria, frequency and urgency.  Musculoskeletal: Negative for arthralgias, back pain and myalgias.  Skin: Negative for pallor and rash.  Allergic/Immunologic: Negative for environmental allergies.  Neurological: Negative for dizziness, syncope and headaches.  Hematological: Negative for adenopathy. Does not bruise/bleed easily.  Psychiatric/Behavioral: Negative for decreased concentration and dysphoric mood. The patient is not nervous/anxious.        Objective:   Physical Exam  Constitutional: She appears well-developed and well-nourished. No distress.  obese and well appearing   HENT:  Head: Normocephalic and atraumatic.  Right Ear: External ear normal.  Left Ear: External ear normal.  Mouth/Throat: Oropharynx is clear and moist.  Eyes: Pupils are equal, round, and reactive to light. Conjunctivae and EOM are normal. No scleral icterus.  Neck: Normal range of motion. Neck supple. No JVD present. Carotid bruit is not present. No thyromegaly present.  Cardiovascular: Normal rate, regular rhythm, normal heart sounds and intact distal pulses. Exam reveals no gallop.  Pulmonary/Chest: Effort normal and breath sounds normal. No respiratory distress. She has no wheezes. She has no rales. She exhibits no tenderness. No breast tenderness, discharge or bleeding.  Abdominal: Soft. Bowel sounds are normal. She exhibits no distension, no abdominal bruit and no mass. There is no tenderness.  Genitourinary: No breast tenderness, discharge or bleeding.  Genitourinary Comments: Breast exam: No mass, nodules, thickening, tenderness, bulging, retraction, inflamation, nipple  discharge or skin changes noted.  No axillary or clavicular LA.      Musculoskeletal: Normal range of motion. She exhibits no edema or tenderness.  Lymphadenopathy:    She has no cervical adenopathy.  Neurological: She is alert. She has normal reflexes. She displays normal reflexes. No cranial nerve deficit. She exhibits normal muscle tone. Coordination normal.  Skin: Skin is warm and dry. No rash noted. No erythema. No pallor.  Skin tags on neck  Psychiatric: She has a normal mood and affect.  Pleasant           Assessment & Plan:

## 2017-12-05 NOTE — Patient Instructions (Addendum)
If you are interested in the new shingles vaccine (Shingrix) - call your local pharmacy to check on coverage and availability  If affordable, get on a wait list at your pharmacy to get the vaccine.  Keep exercising Eat a healthy diet  Drink lots of water   Labs today

## 2017-12-07 NOTE — Assessment & Plan Note (Signed)
Disc goals for lipids and reasons to control them Rev last labs with pt Rev low sat fat diet in detail Labs today Simvastatin and diet

## 2017-12-07 NOTE — Assessment & Plan Note (Signed)
Reviewed health habits including diet and exercise and skin cancer prevention Reviewed appropriate screening tests for age  Also reviewed health mt list, fam hx and immunization status , as well as social and family history   See HPI Labs ordered  Disc shingrix vaccine  Wt loss enc

## 2017-12-07 NOTE — Assessment & Plan Note (Signed)
Discussed how this problem influences overall health and the risks it imposes  Reviewed plan for weight loss with lower calorie diet (via better food choices and also portion control or program like weight watchers) and exercise building up to or more than 30 minutes 5 days per week including some aerobic activity    

## 2017-12-08 MED ORDER — NORGESTIM-ETH ESTRAD TRIPHASIC 0.18/0.215/0.25 MG-35 MCG PO TABS: 1.0000 | ORAL_TABLET | Freq: Every day | ORAL | 3 refills | Status: DC

## 2017-12-08 NOTE — Addendum Note (Signed)
Addended by: Carter Kitten on: 12/08/2017 11:36 AM   Modules accepted: Orders

## 2018-02-18 ENCOUNTER — Encounter: Payer: Self-pay | Admitting: Urology

## 2018-02-18 ENCOUNTER — Ambulatory Visit: Payer: BLUE CROSS/BLUE SHIELD | Admitting: Urology

## 2018-02-18 ENCOUNTER — Other Ambulatory Visit: Payer: Self-pay | Admitting: Radiology

## 2018-02-18 ENCOUNTER — Ambulatory Visit
Admission: RE | Admit: 2018-02-18 | Discharge: 2018-02-18 | Disposition: A | Discharge status: Home / Self Care | Payer: BLUE CROSS/BLUE SHIELD | Source: Ambulatory Visit | Attending: Urology | Admitting: Urology

## 2018-02-18 VITALS — BP 139/87 | HR 99 | Ht 61.7 in | Wt 192.6 lb

## 2018-02-18 DIAGNOSIS — N200 Calculus of kidney: Secondary | ICD-10-CM | POA: Insufficient documentation

## 2018-02-18 DIAGNOSIS — R3129 Other microscopic hematuria: Secondary | ICD-10-CM

## 2018-02-18 LAB — MICROSCOPIC EXAMINATION: WBC, UA: NONE SEEN /hpf (ref 0–5)

## 2018-02-18 LAB — URINALYSIS, COMPLETE
BILIRUBIN UA: NEGATIVE
Glucose, UA: NEGATIVE
Ketones, UA: NEGATIVE
Leukocytes, UA: NEGATIVE
NITRITE UA: NEGATIVE
Protein, UA: NEGATIVE
Specific Gravity, UA: 1.025 (ref 1.005–1.030)
Urobilinogen, Ur: 0.2 mg/dL (ref 0.2–1.0)
pH, UA: 5.5 (ref 5.0–7.5)

## 2018-02-18 NOTE — Progress Notes (Signed)
02/18/2018 12:44 PM   Katherine Schwartz 1965/09/17 [id number]  Referring provider: Abner Greenspan, MD [address] [place], [place] [address]  Chief Complaint  Patient presents with  . Nephrolithiasis    HPI: Katherine Schwartz is a 53 yo F with a history of nonobstructing nephrolithiasis who presents today for the evaluation and management of nephrolithiasis.   She reports of sudden sharp flank pain on her left side and is not sure if its due to working out or stones. She has not had any prior surgeries for her stones.   She reports that she has some upcoming travel planned and is worried about her stone interfering.  She is now more interested in having her stone treated.  Here today to primarily discuss treatment options.  Previous history:  She initially underwent CT stone protocol on 12/2015 indicating 6 mm nonobstructing left midpole stone for left flank pain and microscopic hematuria.  She underwent negative cysto in 03/2016. Given her lack of risk factors including no history of smoking, she elected to not proceed with any further imaging in the form of CT urogram in the setting of presumed cause for her microscopic blood.  On the scan, she was noted to have a large right fundal uterine fibroid.  She is followed by OB/GYN.  KUB on 04/18/17 showed stable left midpole stone. She reported that she was trying to drink plenty of water but admits to having difficulty with this. She drinks lemon juice.   PMH: Past Medical History:  Diagnosis Date  . Acne   . Allergy    allergic rhinitis  . Hyperlipidemia   . Obesity     Surgical History: Past Surgical History:  Procedure Laterality Date  . CESAREAN SECTION  1994    Home Medications:  Allergies as of 02/18/2018      Reactions   Other Nausea And Vomiting   PINE NUTS IN PESTO       Medication List       Accurate as of February 18, 2018 12:44 PM. Always use your most recent med list.          Norgestimate-Ethinyl Estradiol Triphasic 0.18/0.215/0.25 MG-35 MCG tablet Commonly known as:  ORTHO TRI-CYCLEN (28) Take 1 tablet by mouth daily.   simvastatin 20 MG tablet Commonly known as:  ZOCOR Take 1 tablet (20 mg total) by mouth at bedtime.   ZYRTEC-D ALLERGY & CONGESTION 5-120 MG tablet Generic drug:  cetirizine-pseudoephedrine Take 1 tablet by mouth daily.       Allergies:  Allergies  Allergen Reactions  . Other Nausea And Vomiting    PINE NUTS IN PESTO     Family History: Family History  Problem Relation Age of Onset  . Colon cancer Maternal Grandmother 36  . Hypertension Mother   . Hyperlipidemia Mother   . Cancer Maternal Aunt        cervical/ovarian CA  . Cancer Paternal Aunt        breast CA  . Esophageal cancer Neg Hx   . Stomach cancer Neg Hx   . Rectal cancer Neg Hx   . Bladder Cancer Neg Hx   . Kidney cancer Neg Hx   . Breast cancer Neg Hx     Social History:  reports that she has never smoked. She has never used smokeless tobacco. She reports current alcohol use. She reports that she does not use drugs.  ROS: UROLOGY Frequent Urination?: No Hard to postpone urination?: No Burning/pain with  urination?: No Get up at night to urinate?: No Leakage of urine?: No Urine stream starts and stops?: No Trouble starting stream?: No Do you have to strain to urinate?: No Blood in urine?: No Urinary tract infection?: No Sexually transmitted disease?: No Injury to kidneys or bladder?: No Painful intercourse?: No Weak stream?: No Currently pregnant?: No Vaginal bleeding?: No Last menstrual period?: n  Gastrointestinal Nausea?: No Vomiting?: No Indigestion/heartburn?: No Diarrhea?: No Constipation?: No  Constitutional Fever: No Night sweats?: No Weight loss?: No Fatigue?: No  Skin Skin rash/lesions?: No Itching?: No  Eyes Blurred vision?: No Double vision?: No  Ears/Nose/Throat Sore throat?: No Sinus problems?:  No  Hematologic/Lymphatic Swollen glands?: No Easy bruising?: No  Cardiovascular Leg swelling?: No Chest pain?: No  Respiratory Cough?: No Shortness of breath?: No  Endocrine Excessive thirst?: Yes  Musculoskeletal Back pain?: No Joint pain?: No  Neurological Headaches?: No Dizziness?: No  Psychologic Depression?: No Anxiety?: No  Physical Exam: BP 139/87 (BP Location: Left Arm, Patient Position: Sitting, Cuff Size: Normal)   Pulse 99   Ht 5' 1.7" (1.567 m)   Wt 192 lb 9.6 oz (87.4 kg)   BMI 35.57 kg/m   Constitutional:  Alert and oriented, No acute distress. HEENT: [place] AT, moist mucus membranes.  Trachea midline, no masses. Cardiovascular: No clubbing, cyanosis, or edema. Respiratory: Normal respiratory effort, no increased work of breathing. GI: Abdomen is soft, nontender, nondistended, no abdominal masses GU: No CVA tenderness Skin: No rashes, bruises or suspicious lesions. Neurologic: Grossly intact, no focal deficits, moving all 4 extremities. Psychiatric: Normal mood and affect.  Assessment & Plan:    1. Kidney stones  KUB from 04/18/17 showed stable midpole stone  KUB scheduled today to reaccess stone  Given that she has intermittent discomfort as well as concern for issues with the stone which may interfere with her travel, she would like to pursue surgical intervention which is reasonable given the size of her stone  We discussed various treatment options including ESWL vs. ureteroscopy, laser lithotripsy, and stent. We discussed the risks and benefits of both including bleeding, infection, damage to surrounding structures, efficacy with need for possible further intervention, and need for temporary ureteral stent.  Pt interested in ESWL; will return for ESWL.  Preop instructions reviewed.  She is encouraged to drink plenty of fluids and reminded of stone prevention strategies.  2.  Microscopic hematuria Persistent today, previous evaluation  negative, likely related to stone   [facility] [address], [place], [place] [address] [phone]  I, Lucas Mallow, am acting as a scribe for Dr. Hollice Espy,  I have reviewed the above documentation for accuracy and completeness, and I agree with the above.   Hollice Espy, MD

## 2018-02-18 NOTE — H&P (View-Only) (Signed)
02/18/2018 12:44 PM   Katherine Schwartz 1965-02-12 [id number]  Referring provider: Abner Greenspan, MD [address] [place], [place] [address]  Chief Complaint  Patient presents with  . Nephrolithiasis    HPI: Katherine Schwartz is a 53 yo F with a history of nonobstructing nephrolithiasis who presents today for the evaluation and management of nephrolithiasis.   She reports of sudden sharp flank pain on her left side and is not sure if its due to working out or stones. She has not had any prior surgeries for her stones.   She reports that she has some upcoming travel planned and is worried about her stone interfering.  She is now more interested in having her stone treated.  Here today to primarily discuss treatment options.  Previous history:  She initially underwent CT stone protocol on 12/2015 indicating 6 mm nonobstructing left midpole stone for left flank pain and microscopic hematuria.  She underwent negative cysto in 03/2016. Given her lack of risk factors including no history of smoking, she elected to not proceed with any further imaging in the form of CT urogram in the setting of presumed cause for her microscopic blood.  On the scan, she was noted to have a large right fundal uterine fibroid.  She is followed by OB/GYN.  KUB on 04/18/17 showed stable left midpole stone. She reported that she was trying to drink plenty of water but admits to having difficulty with this. She drinks lemon juice.   PMH: Past Medical History:  Diagnosis Date  . Acne   . Allergy    allergic rhinitis  . Hyperlipidemia   . Obesity     Surgical History: Past Surgical History:  Procedure Laterality Date  . CESAREAN SECTION  1994    Home Medications:  Allergies as of 02/18/2018      Reactions   Other Nausea And Vomiting   PINE NUTS IN PESTO       Medication List       Accurate as of February 18, 2018 12:44 PM. Always use your most recent med list.          Norgestimate-Ethinyl Estradiol Triphasic 0.18/0.215/0.25 MG-35 MCG tablet Commonly known as:  ORTHO TRI-CYCLEN (28) Take 1 tablet by mouth daily.   simvastatin 20 MG tablet Commonly known as:  ZOCOR Take 1 tablet (20 mg total) by mouth at bedtime.   ZYRTEC-D ALLERGY & CONGESTION 5-120 MG tablet Generic drug:  cetirizine-pseudoephedrine Take 1 tablet by mouth daily.       Allergies:  Allergies  Allergen Reactions  . Other Nausea And Vomiting    PINE NUTS IN PESTO     Family History: Family History  Problem Relation Age of Onset  . Colon cancer Maternal Grandmother 25  . Hypertension Mother   . Hyperlipidemia Mother   . Cancer Maternal Aunt        cervical/ovarian CA  . Cancer Paternal Aunt        breast CA  . Esophageal cancer Neg Hx   . Stomach cancer Neg Hx   . Rectal cancer Neg Hx   . Bladder Cancer Neg Hx   . Kidney cancer Neg Hx   . Breast cancer Neg Hx     Social History:  reports that she has never smoked. She has never used smokeless tobacco. She reports current alcohol use. She reports that she does not use drugs.  ROS: UROLOGY Frequent Urination?: No Hard to postpone urination?: No Burning/pain with  urination?: No Get up at night to urinate?: No Leakage of urine?: No Urine stream starts and stops?: No Trouble starting stream?: No Do you have to strain to urinate?: No Blood in urine?: No Urinary tract infection?: No Sexually transmitted disease?: No Injury to kidneys or bladder?: No Painful intercourse?: No Weak stream?: No Currently pregnant?: No Vaginal bleeding?: No Last menstrual period?: n  Gastrointestinal Nausea?: No Vomiting?: No Indigestion/heartburn?: No Diarrhea?: No Constipation?: No  Constitutional Fever: No Night sweats?: No Weight loss?: No Fatigue?: No  Skin Skin rash/lesions?: No Itching?: No  Eyes Blurred vision?: No Double vision?: No  Ears/Nose/Throat Sore throat?: No Sinus problems?:  No  Hematologic/Lymphatic Swollen glands?: No Easy bruising?: No  Cardiovascular Leg swelling?: No Chest pain?: No  Respiratory Cough?: No Shortness of breath?: No  Endocrine Excessive thirst?: Yes  Musculoskeletal Back pain?: No Joint pain?: No  Neurological Headaches?: No Dizziness?: No  Psychologic Depression?: No Anxiety?: No  Physical Exam: BP 139/87 (BP Location: Left Arm, Patient Position: Sitting, Cuff Size: Normal)   Pulse 99   Ht 5' 1.7" (1.567 m)   Wt 192 lb 9.6 oz (87.4 kg)   BMI 35.57 kg/m   Constitutional:  Alert and oriented, No acute distress. HEENT: [place] AT, moist mucus membranes.  Trachea midline, no masses. Cardiovascular: No clubbing, cyanosis, or edema. Respiratory: Normal respiratory effort, no increased work of breathing. GI: Abdomen is soft, nontender, nondistended, no abdominal masses GU: No CVA tenderness Skin: No rashes, bruises or suspicious lesions. Neurologic: Grossly intact, no focal deficits, moving all 4 extremities. Psychiatric: Normal mood and affect.  Assessment & Plan:    1. Kidney stones  KUB from 04/18/17 showed stable midpole stone  KUB scheduled today to reaccess stone  Given that she has intermittent discomfort as well as concern for issues with the stone which may interfere with her travel, she would like to pursue surgical intervention which is reasonable given the size of her stone  We discussed various treatment options including ESWL vs. ureteroscopy, laser lithotripsy, and stent. We discussed the risks and benefits of both including bleeding, infection, damage to surrounding structures, efficacy with need for possible further intervention, and need for temporary ureteral stent.  Pt interested in ESWL; will return for ESWL.  Preop instructions reviewed.  She is encouraged to drink plenty of fluids and reminded of stone prevention strategies.  2.  Microscopic hematuria Persistent today, previous evaluation  negative, likely related to stone   [facility] [address], [place], [place] [address] [phone]  I, Lucas Mallow, am acting as a scribe for Dr. Hollice Espy,  I have reviewed the above documentation for accuracy and completeness, and I agree with the above.   Hollice Espy, MD

## 2018-02-21 LAB — CULTURE, URINE COMPREHENSIVE

## 2018-02-26 ENCOUNTER — Ambulatory Visit
Admission: RE | Admit: 2018-02-26 | Discharge: 2018-02-26 | Disposition: A | Discharge status: Home / Self Care | Payer: BLUE CROSS/BLUE SHIELD | Attending: Urology | Admitting: Urology

## 2018-02-26 ENCOUNTER — Ambulatory Visit: Payer: BLUE CROSS/BLUE SHIELD

## 2018-02-26 ENCOUNTER — Encounter: Payer: Self-pay | Admitting: Emergency Medicine

## 2018-02-26 ENCOUNTER — Other Ambulatory Visit: Payer: Self-pay

## 2018-02-26 ENCOUNTER — Encounter
Admission: RE | Disposition: A | Discharge status: Home / Self Care | Payer: Self-pay | Source: Home / Self Care | Attending: Urology

## 2018-02-26 DIAGNOSIS — E669 Obesity, unspecified: Secondary | ICD-10-CM | POA: Insufficient documentation

## 2018-02-26 DIAGNOSIS — Z8049 Family history of malignant neoplasm of other genital organs: Secondary | ICD-10-CM | POA: Diagnosis not present

## 2018-02-26 DIAGNOSIS — Z800 Family history of malignant neoplasm of digestive organs: Secondary | ICD-10-CM | POA: Diagnosis not present

## 2018-02-26 DIAGNOSIS — E785 Hyperlipidemia, unspecified: Secondary | ICD-10-CM | POA: Diagnosis not present

## 2018-02-26 DIAGNOSIS — Z6835 Body mass index (BMI) 35.0-35.9, adult: Secondary | ICD-10-CM | POA: Insufficient documentation

## 2018-02-26 DIAGNOSIS — Z803 Family history of malignant neoplasm of breast: Secondary | ICD-10-CM | POA: Diagnosis not present

## 2018-02-26 DIAGNOSIS — Z91018 Allergy to other foods: Secondary | ICD-10-CM | POA: Diagnosis not present

## 2018-02-26 DIAGNOSIS — Z79899 Other long term (current) drug therapy: Secondary | ICD-10-CM | POA: Insufficient documentation

## 2018-02-26 DIAGNOSIS — Z8249 Family history of ischemic heart disease and other diseases of the circulatory system: Secondary | ICD-10-CM | POA: Insufficient documentation

## 2018-02-26 DIAGNOSIS — N200 Calculus of kidney: Secondary | ICD-10-CM | POA: Diagnosis not present

## 2018-02-26 HISTORY — PX: EXTRACORPOREAL SHOCK WAVE LITHOTRIPSY: SHX1557

## 2018-02-26 LAB — POCT PREGNANCY, URINE: Preg Test, Ur: NEGATIVE

## 2018-02-26 SURGERY — LITHOTRIPSY, ESWL
Anesthesia: Moderate Sedation | Laterality: Left

## 2018-02-26 MED ORDER — DIAZEPAM 5 MG PO TABS
10.0000 mg | ORAL_TABLET | ORAL | Status: AC
  Administered 2018-02-26: 10 mg via ORAL

## 2018-02-26 MED ORDER — ONDANSETRON HCL 4 MG/2ML IJ SOLN
INTRAMUSCULAR | Status: AC
  Administered 2018-02-26: 4 mg via INTRAVENOUS
  Filled 2018-02-26: qty 2

## 2018-02-26 MED ORDER — DIAZEPAM 5 MG PO TABS
ORAL_TABLET | ORAL | Status: AC
  Administered 2018-02-26: 10 mg via ORAL
  Filled 2018-02-26: qty 2

## 2018-02-26 MED ORDER — SODIUM CHLORIDE 0.9 % IV SOLN
INTRAVENOUS | Status: DC
  Administered 2018-02-26: 07:00:00 via INTRAVENOUS

## 2018-02-26 MED ORDER — DIPHENHYDRAMINE HCL 25 MG PO CAPS
ORAL_CAPSULE | ORAL | Status: AC
  Administered 2018-02-26: 25 mg via ORAL
  Filled 2018-02-26: qty 1

## 2018-02-26 MED ORDER — HYDROCODONE-ACETAMINOPHEN 5-325 MG PO TABS: 1.0000 | ORAL_TABLET | Freq: Four times a day (QID) | ORAL | 0 refills | Status: DC | PRN

## 2018-02-26 MED ORDER — ONDANSETRON HCL 2 MG/ML IV SOLN: 4.0000 mg | Freq: Once | INTRAVENOUS | Status: DC

## 2018-02-26 MED ORDER — DOCUSATE SODIUM 100 MG PO CAPS: 100.0000 mg | ORAL_CAPSULE | Freq: Two times a day (BID) | ORAL | 0 refills | Status: DC

## 2018-02-26 MED ORDER — TAMSULOSIN HCL 0.4 MG PO CAPS: 0.4000 mg | ORAL_CAPSULE | Freq: Every day | ORAL | 0 refills | Status: DC

## 2018-02-26 MED ORDER — CIPROFLOXACIN HCL 500 MG PO TABS
ORAL_TABLET | ORAL | Status: AC
  Administered 2018-02-26: 500 mg via ORAL
  Filled 2018-02-26: qty 1

## 2018-02-26 MED ORDER — ONDANSETRON HCL 4 MG/2ML IJ SOLN
4.0000 mg | Freq: Once | INTRAMUSCULAR | Status: AC
  Administered 2018-02-26: 4 mg via INTRAVENOUS

## 2018-02-26 MED ORDER — DIPHENHYDRAMINE HCL 25 MG PO CAPS
25.0000 mg | ORAL_CAPSULE | ORAL | Status: AC
  Administered 2018-02-26: 25 mg via ORAL

## 2018-02-26 MED ORDER — CIPROFLOXACIN HCL 500 MG PO TABS
500.0000 mg | ORAL_TABLET | ORAL | Status: AC
  Administered 2018-02-26: 500 mg via ORAL

## 2018-02-26 NOTE — Interval H&P Note (Signed)
History and Physical Interval Note:  02/26/2018 8:43 AM  Katherine Schwartz  has presented today for surgery, with the diagnosis of Left Nephrolithiasis  The various methods of treatment have been discussed with the patient and family. After consideration of risks, benefits and other options for treatment, the patient has consented to  Procedure(s): EXTRACORPOREAL SHOCK WAVE LITHOTRIPSY (ESWL) (Left) as a surgical intervention .  The patient's history has been reviewed, patient examined, no change in status, stable for surgery.  I have reviewed the patient's chart and labs.  Questions were answered to the patient's satisfaction.     Hollice Espy

## 2018-02-26 NOTE — Discharge Instructions (Signed)
See [facility] discharge instructions in chart.  

## 2018-03-07 NOTE — Progress Notes (Incomplete)
03/11/2018  4:15 PM   Katherine Schwartz 1965-01-31 [id number]  Referring provider: Abner Greenspan, MD [place], [place] [address]  No chief complaint on file.   HPI: Katherine Schwartz is a 53 y.o. female with a history of nonobstructing nephrolithiasis who presents today for 2 week follow up post ESWL on 02/26/2018.  ***  PMH: Past Medical History:  Diagnosis Date   Acne    Allergy    allergic rhinitis   Hyperlipidemia    Obesity     Surgical History: Past Surgical History:  Procedure Laterality Date   CESAREAN SECTION  1994   EXTRACORPOREAL SHOCK WAVE LITHOTRIPSY Left 02/26/2018   Procedure: EXTRACORPOREAL SHOCK WAVE LITHOTRIPSY (ESWL);  Surgeon: Hollice Espy, MD;  Location: [facility] ORS;  Service: Urology;  Laterality: Left;    Home Medications:  Allergies as of 03/11/2018      Reactions   Other Nausea And Vomiting   PINE NUTS IN PESTO       Medication List       Accurate as of March 07, 2018  4:15 PM. Always use your most recent med list.        docusate sodium 100 MG capsule Commonly known as:  COLACE Take 1 capsule (100 mg total) by mouth 2 (two) times daily.   HYDROcodone-acetaminophen 5-325 MG tablet Commonly known as:  NORCO/VICODIN Take 1-2 tablets by mouth every 6 (six) hours as needed for moderate pain.   Norgestimate-Ethinyl Estradiol Triphasic 0.18/0.215/0.25 MG-35 MCG tablet Commonly known as:  ORTHO TRI-CYCLEN (28) Take 1 tablet by mouth daily.   simvastatin 20 MG tablet Commonly known as:  ZOCOR Take 1 tablet (20 mg total) by mouth at bedtime.   tamsulosin 0.4 MG Caps capsule Commonly known as:  FLOMAX Take 1 capsule (0.4 mg total) by mouth daily.   ZYRTEC-D ALLERGY & CONGESTION 5-120 MG tablet Generic drug:  cetirizine-pseudoephedrine Take 1 tablet by mouth daily.       Allergies:  Allergies  Allergen Reactions   Other Nausea And Vomiting    PINE NUTS IN PESTO     Family History: Family  History  Problem Relation Age of Onset   Colon cancer Maternal Grandmother 46   Hypertension Mother    Hyperlipidemia Mother    Cancer Maternal Aunt        cervical/ovarian CA   Cancer Paternal Aunt        breast CA   Esophageal cancer Neg Hx    Stomach cancer Neg Hx    Rectal cancer Neg Hx    Bladder Cancer Neg Hx    Kidney cancer Neg Hx    Breast cancer Neg Hx     Social History:  reports that she has never smoked. She has never used smokeless tobacco. She reports current alcohol use. She reports that she does not use drugs.  ROS:                                        Physical Exam: There were no vitals taken for this visit.  Constitutional:  Alert and oriented, No acute distress. HEENT: [place] AT, moist mucus membranes.  Trachea midline, no masses. Cardiovascular: No clubbing, cyanosis, or edema. Respiratory: Normal respiratory effort, no increased work of breathing. GI: Abdomen is soft, nontender, nondistended, no abdominal masses GU: No CVA tenderness Lymph: No cervical or inguinal lymphadenopathy. Skin:  No rashes, bruises or suspicious lesions. Neurologic: Grossly intact, no focal deficits, moving all 4 extremities. Psychiatric: Normal mood and affect.  Laboratory Data: Lab Results  Component Value Date   WBC 4.2 12/05/2017   HGB 13.5 12/05/2017   HCT 40.2 12/05/2017   MCV 94.1 12/05/2017   PLT 236.0 12/05/2017    Lab Results  Component Value Date   CREATININE 0.64 12/05/2017    No results found for: PSA  No results found for: TESTOSTERONE  No results found for: HGBA1C  Urinalysis    Component Value Date/Time   APPEARANCEUR Cloudy (A) 02/18/2018 0846   GLUCOSEU Negative 02/18/2018 0846   BILIRUBINUR Negative 02/18/2018 0846   PROTEINUR Negative 02/18/2018 0846   NITRITE Negative 02/18/2018 0846   LEUKOCYTESUR Negative 02/18/2018 0846    Lab Results  Component Value Date   LABMICR See below: 02/18/2018   WBCUA  None seen 02/18/2018   RBCUA 11-30 (A) 02/18/2018   LABEPIT 0-10 02/18/2018   MUCUS Present (A) 02/18/2018   BACTERIA Many (A) 02/18/2018    Pertinent Imaging: *** Results for orders placed during the hospital encounter of 02/26/18  DG Abd 1 View   Narrative CLINICAL DATA:  Preoperative evaluation for upcoming lithotripsy, history of left nephrolithiasis  EXAM: ABDOMEN - 1 VIEW  COMPARISON:  02/18/2018  FINDINGS: Scattered large and small bowel gas is noted. Triangular shaped calcification is again noted over the mid left kidney stable from the prior exam. Multiple phleboliths are again seen within the pelvis. No definitive ureteral stones are seen.  IMPRESSION: Stable left renal stone.   Electronically Signed   By: Inez Catalina M.D.   On: 02/26/2018 08:27    No results found for this or any previous visit. No results found for this or any previous visit. No results found for this or any previous visit. No results found for this or any previous visit. No results found for this or any previous visit. No results found for this or any previous visit. Results for orders placed during the hospital encounter of 01/12/16  CT RENAL STONE STUDY   Narrative CLINICAL DATA:  Left flank pain for 2 weeks.  Microscopic hematuria.  EXAM: CT ABDOMEN AND PELVIS WITHOUT CONTRAST  TECHNIQUE: Multidetector CT imaging of the abdomen and pelvis was performed following the standard protocol without IV contrast.  COMPARISON:  None.  FINDINGS: Lower chest: No acute findings.  Hepatobiliary: No masses visualized on this unenhanced exam. Gallbladder is unremarkable.  Pancreas: No mass or inflammatory process visualized on this unenhanced exam.  Spleen:  Within normal limits in size.  Adrenals/Urinary tract: 6 mm nonobstructive calculus seen in midpole of left kidney. No other renal calculi identified. No evidence of ureteral calculi or hydronephrosis. Unremarkable urinary  bladder  Stomach/Bowel: No evidence of obstruction, inflammatory process, or abnormal fluid collections. Normal appendix visualized.  Vascular/Lymphatic: No pathologically enlarged lymph nodes identified. No evidence of abdominal aortic aneurysm.  Reproductive: Large right fundal uterine fibroid seen measuring 8.5 cm. No adnexal mass or free fluid identified.  Other:  None.  Musculoskeletal:  No suspicious bone lesions identified.  IMPRESSION: 6 mm nonobstructive left renal calculus. No evidence of ureteral calculi or hydronephrosis.  Large right fundal uterine fibroid.   Electronically Signed   By: Earle Gell M.D.   On: 01/12/2016 14:23     Assessment & Plan:    1. Kidney stones - s/p ESWL on 02/26/2018 - ***  2. Microscopic hematuria - ***  No follow-ups on file.  [facility] [address], [place], [place] [address] [phone]  I, Adele Schilder, am acting as a scribe for Hollice Espy, MD.    {Add Scribe Attestation Statement}

## 2018-03-11 ENCOUNTER — Ambulatory Visit: Payer: BLUE CROSS/BLUE SHIELD | Admitting: Urology

## 2018-03-13 ENCOUNTER — Ambulatory Visit
Admission: RE | Admit: 2018-03-13 | Discharge: 2018-03-13 | Disposition: A | Discharge status: Home / Self Care | Payer: BLUE CROSS/BLUE SHIELD | Source: Ambulatory Visit | Attending: Urology | Admitting: Urology

## 2018-03-13 ENCOUNTER — Encounter: Payer: Self-pay | Admitting: Urology

## 2018-03-13 ENCOUNTER — Ambulatory Visit (INDEPENDENT_AMBULATORY_CARE_PROVIDER_SITE_OTHER): Payer: BLUE CROSS/BLUE SHIELD | Admitting: Urology

## 2018-03-13 VITALS — BP 135/85 | HR 90 | Ht 62.0 in | Wt 193.0 lb

## 2018-03-13 DIAGNOSIS — N200 Calculus of kidney: Secondary | ICD-10-CM | POA: Insufficient documentation

## 2018-03-13 DIAGNOSIS — A599 Trichomoniasis, unspecified: Secondary | ICD-10-CM | POA: Diagnosis not present

## 2018-03-13 MED ORDER — METRONIDAZOLE 500 MG PO TABS: 500.0000 mg | ORAL_TABLET | Freq: Three times a day (TID) | ORAL | 0 refills | Status: DC

## 2018-03-13 NOTE — Progress Notes (Signed)
03/13/2018 2:22 PM   Katherine Schwartz May 09, 1965 [id number]  Referring provider: Abner Greenspan, MD [address] [place], [place] [address]  Chief Complaint  Patient presents with  . Nephrolithiasis    2wk post ESWL    HPI: 53 year old female with a personal street of nonobstructing nephrolithiasis who recently underwent left ESWL (11 mm) for a nonobstructing stone.  She returns today for follow-up with KUB.  This demonstrates excellent fragmentation of the stone with a residual 3 mm calculus in the left upper pole.  There is also a 4 mm mm calcification in the left lower pole, question whether this or fragment that is migrated into a lower pole calyx.    She tolerated the procedure well.  She did have some fairly significant soreness in her flank and abdominal area for 1 to 2 days postop.  She had some blood in her urine but she was unsure whether this was related to her menses or bleeding.  She is not able to catch any stone fragments.  Today, she denies any flank pain or urinary symptoms.  She does note today that on her preop urine trichomonas was noted and not treated.  She has been sexually active with a new partner which she is no longer with.  She denies any vaginal discharge.  She was wondering if she should be treated for this.  Previous history:  She initially underwent CT stone protocol on 12/2015 indicating 6 mm nonobstructing left midpole stone for left flank pain and microscopic hematuria. She underwent negative cysto in 03/2016. Given her lack of risk factors including no history of smoking, she elected to not proceed with any further imaging in the form of CT urogram in the setting of presumed cause for her microscopic blood.   On the scan, she was noted to have a large right fundal uterine fibroid. She is followed by OB/GYN.  PMH: Past Medical History:  Diagnosis Date  . Acne   . Allergy    allergic rhinitis  . Hyperlipidemia   . Obesity      Surgical History: Past Surgical History:  Procedure Laterality Date  . CESAREAN SECTION  1994  . EXTRACORPOREAL SHOCK WAVE LITHOTRIPSY Left 02/26/2018   Procedure: EXTRACORPOREAL SHOCK WAVE LITHOTRIPSY (ESWL);  Surgeon: Hollice Espy, MD;  Location: [facility] ORS;  Service: Urology;  Laterality: Left;    Home Medications:  Allergies as of 03/13/2018      Reactions   Other Nausea And Vomiting   PINE NUTS IN PESTO       Medication List       Accurate as of March 13, 2018  2:22 PM. Always use your most recent med list.        Norgestimate-Ethinyl Estradiol Triphasic 0.18/0.215/0.25 MG-35 MCG tablet Commonly known as:  ORTHO TRI-CYCLEN (28) Take 1 tablet by mouth daily.   simvastatin 20 MG tablet Commonly known as:  ZOCOR Take 1 tablet (20 mg total) by mouth at bedtime.   ZYRTEC-D ALLERGY & CONGESTION 5-120 MG tablet Generic drug:  cetirizine-pseudoephedrine Take 1 tablet by mouth daily.       Allergies:  Allergies  Allergen Reactions  . Other Nausea And Vomiting    PINE NUTS IN PESTO     Family History: Family History  Problem Relation Age of Onset  . Colon cancer Maternal Grandmother 20  . Hypertension Mother   . Hyperlipidemia Mother   . Cancer Maternal Aunt        cervical/ovarian CA  .  Cancer Paternal Aunt        breast CA  . Esophageal cancer Neg Hx   . Stomach cancer Neg Hx   . Rectal cancer Neg Hx   . Bladder Cancer Neg Hx   . Kidney cancer Neg Hx   . Breast cancer Neg Hx     Social History:  reports that she has never smoked. She has never used smokeless tobacco. She reports current alcohol use. She reports that she does not use drugs.  ROS: UROLOGY Frequent Urination?: No Hard to postpone urination?: No Burning/pain with urination?: No Get up at night to urinate?: No Leakage of urine?: No Urine stream starts and stops?: No Trouble starting stream?: No Do you have to strain to urinate?: No Blood in urine?: No Urinary tract  infection?: No Sexually transmitted disease?: No Injury to kidneys or bladder?: No Painful intercourse?: No Weak stream?: No Currently pregnant?: No Vaginal bleeding?: No Last menstrual period?: n  Gastrointestinal Nausea?: No Vomiting?: No Indigestion/heartburn?: No Diarrhea?: No Constipation?: No  Constitutional Fever: No Night sweats?: No Weight loss?: No Fatigue?: No  Skin Skin rash/lesions?: No Itching?: No  Eyes Blurred vision?: No Double vision?: No  Ears/Nose/Throat Sore throat?: No Sinus problems?: No  Hematologic/Lymphatic Swollen glands?: No Easy bruising?: No  Cardiovascular Leg swelling?: No Chest pain?: No  Respiratory Cough?: No Shortness of breath?: No  Endocrine Excessive thirst?: No  Musculoskeletal Back pain?: No Joint pain?: No  Neurological Headaches?: No Dizziness?: No  Psychologic Depression?: No Anxiety?: No  Physical Exam: BP 135/85   Pulse 90   Ht 5\' 2"  (1.575 m)   Wt 193 lb (87.5 kg)   BMI 35.30 kg/m   Constitutional:  Alert and oriented, No acute distress. HEENT: [place] AT, moist mucus membranes.  Trachea midline, no masses. Cardiovascular: No clubbing, cyanosis, or edema. Respiratory: Normal respiratory effort, no increased work of breathing. GI: Abdomen is soft, nontender, nondistended, no abdominal masses GU: No CVA tenderness Skin: No rashes, bruises or suspicious lesions. Neurologic: Grossly intact, no focal deficits, moving all 4 extremities. Psychiatric: Normal mood and affect.  Laboratory Data: Lab Results  Component Value Date   WBC 4.2 12/05/2017   HGB 13.5 12/05/2017   HCT 40.2 12/05/2017   MCV 94.1 12/05/2017   PLT 236.0 12/05/2017    Lab Results  Component Value Date   CREATININE 0.64 12/05/2017    Pertinent Imaging: KUB today was personally reviewed.  Radiology has yet to read the report.  It does show 2 stone fragments, approximately 3 mm in the upper pole as well as 3 mm in the lower  pole.  This was directly compared to preop KUB which shows a triangular 11 mm stone in the left upper pole.  No obvious ureteral stones identified.  Assessment & Plan:    1. Kidney stones Status post ESWL with excellent fragmentation of the large 11 mm left upper triangular pole stone She does have 2 separate 3 mm fragments in her left kidney, one in the upper and lower pole Encourage adequate hydration we will continue to follow the smaller stones Plan for KUB in 3 months We discussed that if she continues to have fragments at that point in time, would recommend observation but alternatives include repeat ESWL versus ureteroscopy as surgical options if her overall goal is to be stone free She is agreeable this plan Warning symptoms reviewed  2. Trichomoniasis Did not asymptomatic trichomonas in urine appreciated Flagyl 500 mg twice daily x7 days - Abdomen  1 view (KUB); Future   Return in about 3 months (around 06/11/2018) for KUB.  Hollice Espy, MD  [facility] [address], [place],  [address] [phone]

## 2018-04-21 ENCOUNTER — Other Ambulatory Visit: Payer: Self-pay | Admitting: Family Medicine

## 2018-04-21 DIAGNOSIS — Z1231 Encounter for screening mammogram for malignant neoplasm of breast: Secondary | ICD-10-CM

## 2018-06-04 ENCOUNTER — Ambulatory Visit
Admission: RE | Admit: 2018-06-04 | Discharge: 2018-06-04 | Disposition: A | Discharge status: Home / Self Care | Payer: BLUE CROSS/BLUE SHIELD | Attending: Urology | Admitting: Urology

## 2018-06-04 ENCOUNTER — Other Ambulatory Visit: Payer: Self-pay

## 2018-06-04 ENCOUNTER — Ambulatory Visit
Admission: RE | Admit: 2018-06-04 | Discharge: 2018-06-04 | Disposition: A | Discharge status: Home / Self Care | Payer: BLUE CROSS/BLUE SHIELD | Source: Ambulatory Visit | Attending: Urology | Admitting: Urology

## 2018-06-04 DIAGNOSIS — A599 Trichomoniasis, unspecified: Secondary | ICD-10-CM | POA: Diagnosis not present

## 2018-06-04 DIAGNOSIS — N200 Calculus of kidney: Secondary | ICD-10-CM | POA: Diagnosis not present

## 2018-06-09 ENCOUNTER — Other Ambulatory Visit: Payer: Self-pay

## 2018-06-09 ENCOUNTER — Telehealth (INDEPENDENT_AMBULATORY_CARE_PROVIDER_SITE_OTHER): Payer: BLUE CROSS/BLUE SHIELD | Admitting: Urology

## 2018-06-09 DIAGNOSIS — Z87442 Personal history of urinary calculi: Secondary | ICD-10-CM | POA: Diagnosis not present

## 2018-06-09 NOTE — Progress Notes (Signed)
Virtual Visit via Video Note  I connected with Katherine Schwartz on 06/09/18 at 11:00 AM EDT by a video enabled telemedicine application and verified that I am speaking with the correct person using two identifiers.  Location: Patient: home Provider: home   I discussed the limitations of evaluation and management by telemedicine and the availability of in person appointments. The patient expressed understanding and agreed to proceed.  History of Present Illness: 53 year old female with a personal history of nephrolithiasis who underwent shockwave ESWL 02/26/18 for an 11 mm left-sided nonobstructing stone.  Following the procedure, she returned to the office 2 weeks later with 2 ureteral fragments which were retained.    Same day as above, she went home and drink tons of water.  She was able to flush out the stones and saw some fragments pass.  KUB today shows no additional ureteral or kidney stones.  Previous KUB, there was concern of a 3 mm left upper pole fragment which was not appreciated today.  She does have rare intermittent episodes of left flank pain, last a few days ago which was not severe and may been related to doing abdominal strengthening exercises.  The pain is not severe and seems to be exacerbated with physical activity.  Urinary issues.  No dysuria.  No gross hematuria.  Overall doing well.  Notably, time of last visit, she was incidentally found to have trichomonas.  This was appropriately treated and she was asymptomatic at the time.  She has not been with a partner since December.  Since last visit, she has been working very hard to drink plenty of water, avoid unhealthy high salt containing foods, increased citric acid, etc.   Observations/Objective: Appears well, no acute distress  Assessment and Plan:  1. History of kidney stones KUB personally reviewed today, no residual retained ureteral fragments identified No obvious kidney stones No previous history of  stone analysis, advised that if she ever does pass a stone in the future, bring stone for analysis Discussed stone diet again today We will hold off on metabolic analysis at this point in time given that she has had fairly infrequent stone episodes   Follow Up Instructions: 1 year with KUB   I discussed the assessment and treatment plan with the patient. The patient was provided an opportunity to ask questions and all were answered. The patient agreed with the plan and demonstrated an understanding of the instructions.   The patient was advised to call back or seek an in-person evaluation if the symptoms worsen or if the condition fails to improve as anticipated.  I provided 12 minutes of non-face-to-face time during this encounter.   Hollice Espy, MD

## 2018-06-18 ENCOUNTER — Ambulatory Visit: Payer: BLUE CROSS/BLUE SHIELD

## 2018-07-30 ENCOUNTER — Ambulatory Visit
Admission: RE | Admit: 2018-07-30 | Discharge: 2018-07-30 | Disposition: A | Discharge status: Home / Self Care | Payer: BC Managed Care – PPO | Source: Ambulatory Visit | Attending: Family Medicine | Admitting: Family Medicine

## 2018-07-30 ENCOUNTER — Other Ambulatory Visit: Payer: Self-pay

## 2018-07-30 DIAGNOSIS — Z1231 Encounter for screening mammogram for malignant neoplasm of breast: Secondary | ICD-10-CM

## 2018-08-06 ENCOUNTER — Ambulatory Visit: Payer: BLUE CROSS/BLUE SHIELD

## 2018-10-27 ENCOUNTER — Telehealth: Payer: Self-pay

## 2018-10-27 MED ORDER — NORGESTIM-ETH ESTRAD TRIPHASIC 0.18/0.215/0.25 MG-35 MCG PO TABS: 1.0000 | ORAL_TABLET | Freq: Every day | ORAL | 0 refills | Status: DC

## 2018-10-27 NOTE — Telephone Encounter (Signed)
Pt said she is almost out of her BC pill and [facility] said she has no refills. I spoke with [place] at [employer] [employer] and she is not sure why pt does not have refill thru 11/2018 but pt got refill 07/2018. Pt said she has not lost a pack of pills and has not missed any pills. Refilled ortho tri cyclen # 84  Until CPX on 12/10/18. FYI to Dr Glori Bickers.

## 2018-10-27 NOTE — Telephone Encounter (Signed)
Aware, thanks!

## 2018-12-01 IMAGING — CR DG ABDOMEN 1V
1 series · 2 of 2 positions shown · non-contrast
Comparison: CT scan of January 12, 2016.

CLINICAL DATA: Kidney stones.

EXAM:
ABDOMEN - 1 VIEW

[Series 1: dg abd 1 view · 0.14mm/px · 2 of 2 slices shown]
[im 1/2]
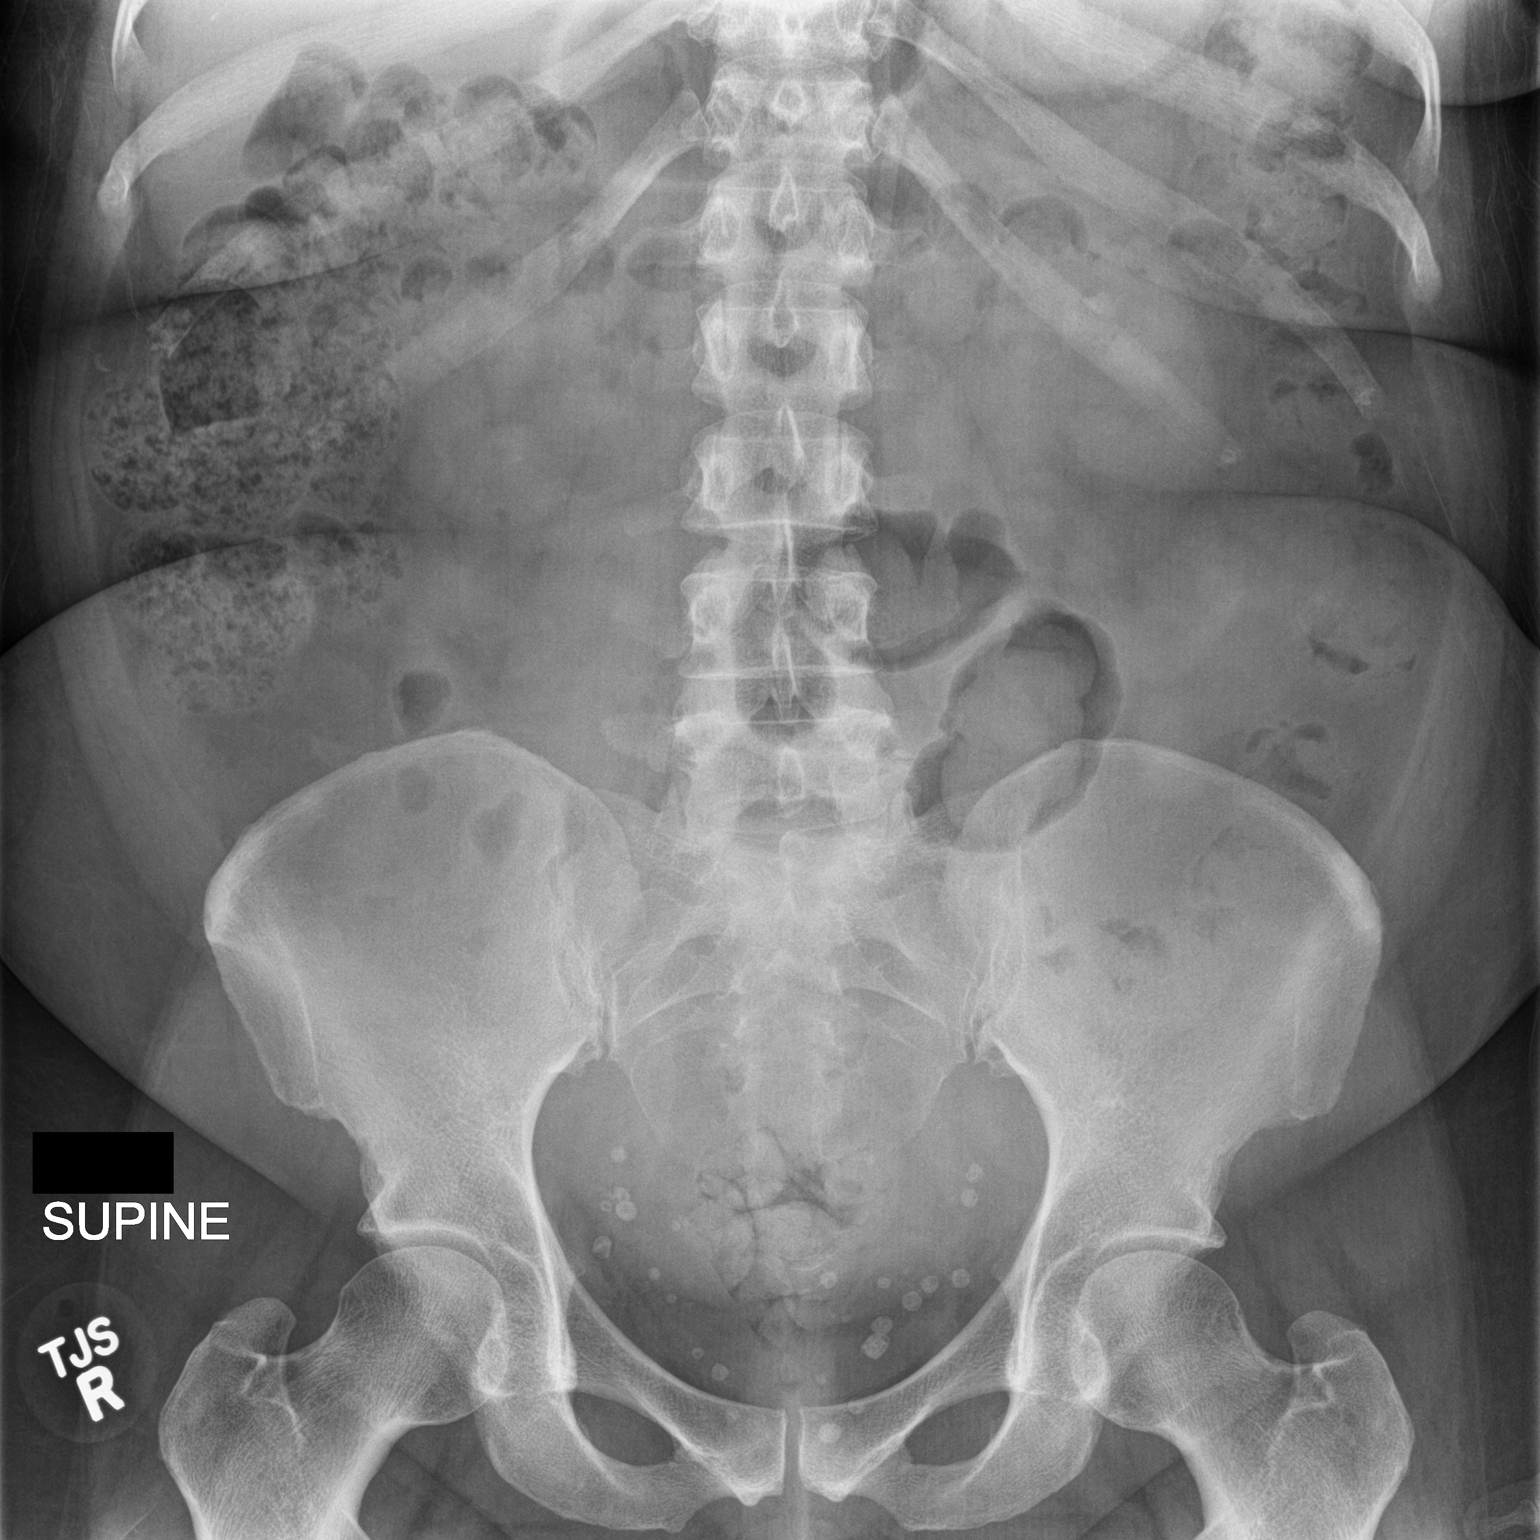
[im 2/2]
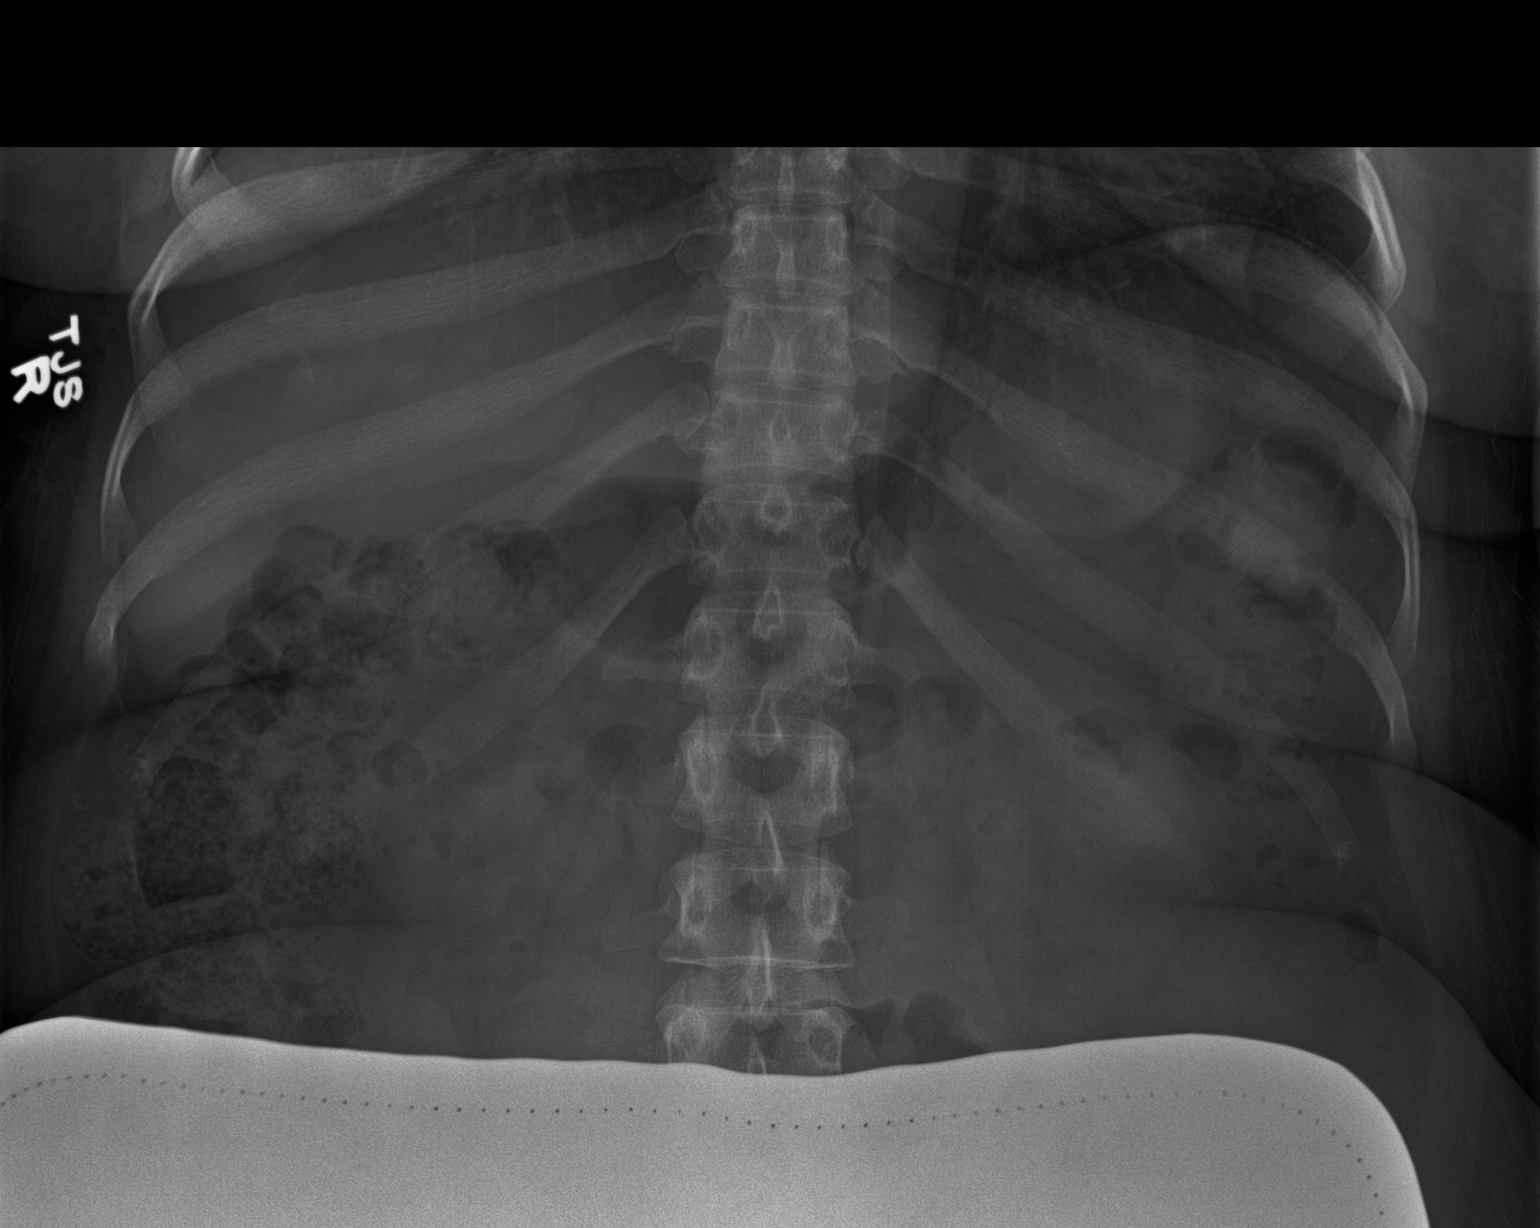

[2 of 2 positions shown; findings below may reference images not displayed]

FINDINGS: The bowel gas pattern is normal. Phleboliths are noted in the
pelvis. Large left renal calculus is noted which corresponds to
finding on CT scan.
IMPRESSION: Left renal calculus.  No evidence of bowel obstruction or ileus.

## 2018-12-10 ENCOUNTER — Other Ambulatory Visit (HOSPITAL_COMMUNITY)
Admission: RE | Admit: 2018-12-10 | Discharge: 2018-12-10 | Disposition: A | Discharge status: Home / Self Care | Payer: BC Managed Care – PPO | Source: Ambulatory Visit | Attending: Family Medicine | Admitting: Family Medicine

## 2018-12-10 ENCOUNTER — Ambulatory Visit (INDEPENDENT_AMBULATORY_CARE_PROVIDER_SITE_OTHER): Payer: BC Managed Care – PPO | Admitting: Family Medicine

## 2018-12-10 ENCOUNTER — Encounter: Payer: Self-pay | Admitting: Family Medicine

## 2018-12-10 ENCOUNTER — Other Ambulatory Visit: Payer: Self-pay

## 2018-12-10 VITALS — BP 118/82 | HR 82 | Temp 97.7°F | Ht 62.0 in | Wt 183.0 lb

## 2018-12-10 DIAGNOSIS — Z01419 Encounter for gynecological examination (general) (routine) without abnormal findings: Secondary | ICD-10-CM

## 2018-12-10 DIAGNOSIS — E7800 Pure hypercholesterolemia, unspecified: Secondary | ICD-10-CM | POA: Diagnosis not present

## 2018-12-10 DIAGNOSIS — Z113 Encounter for screening for infections with a predominantly sexual mode of transmission: Secondary | ICD-10-CM | POA: Diagnosis not present

## 2018-12-10 DIAGNOSIS — Z6834 Body mass index (BMI) 34.0-34.9, adult: Secondary | ICD-10-CM

## 2018-12-10 DIAGNOSIS — D259 Leiomyoma of uterus, unspecified: Secondary | ICD-10-CM

## 2018-12-10 DIAGNOSIS — E6609 Other obesity due to excess calories: Secondary | ICD-10-CM

## 2018-12-10 DIAGNOSIS — Z0000 Encounter for general adult medical examination without abnormal findings: Secondary | ICD-10-CM

## 2018-12-10 LAB — CBC WITH DIFFERENTIAL/PLATELET
Basophils Absolute: 0 10*3/uL (ref 0.0–0.1)
Basophils Relative: 1.2 % (ref 0.0–3.0)
Eosinophils Absolute: 0.1 10*3/uL (ref 0.0–0.7)
Eosinophils Relative: 3.3 % (ref 0.0–5.0)
HCT: 38.3 % (ref 36.0–46.0)
Hemoglobin: 12.9 g/dL (ref 12.0–15.0)
Lymphocytes Relative: 28 % (ref 12.0–46.0)
Lymphs Abs: 1.1 10*3/uL (ref 0.7–4.0)
MCHC: 33.5 g/dL (ref 30.0–36.0)
MCV: 96.4 fl (ref 78.0–100.0)
Monocytes Absolute: 0.4 10*3/uL (ref 0.1–1.0)
Monocytes Relative: 11.2 % (ref 3.0–12.0)
Neutro Abs: 2.2 10*3/uL (ref 1.4–7.7)
Neutrophils Relative %: 56.3 % (ref 43.0–77.0)
Platelets: 225 10*3/uL (ref 150.0–400.0)
RBC: 3.98 Mil/uL (ref 3.87–5.11)
RDW: 12.3 % (ref 11.5–15.5)
WBC: 3.9 10*3/uL — ABNORMAL LOW (ref 4.0–10.5)

## 2018-12-10 LAB — COMPREHENSIVE METABOLIC PANEL
ALT: 13 U/L (ref 0–35)
AST: 16 U/L (ref 0–37)
Albumin: 4.2 g/dL (ref 3.5–5.2)
Alkaline Phosphatase: 39 U/L (ref 39–117)
BUN: 13 mg/dL (ref 6–23)
CO2: 27 mEq/L (ref 19–32)
Calcium: 9.4 mg/dL (ref 8.4–10.5)
Chloride: 105 mEq/L (ref 96–112)
Creatinine, Ser: 0.68 mg/dL (ref 0.40–1.20)
GFR: 108.64 mL/min (ref >60.00)
Glucose, Bld: 84 mg/dL (ref 70–99)
Potassium: 3.9 mEq/L (ref 3.5–5.1)
Sodium: 138 mEq/L (ref 135–145)
Total Bilirubin: 0.5 mg/dL (ref 0.2–1.2)
Total Protein: 7.4 g/dL (ref 6.0–8.3)

## 2018-12-10 LAB — LIPID PANEL
Cholesterol: 217 mg/dL — ABNORMAL HIGH (ref 0–200)
HDL: 69.8 mg/dL (ref >39.00)
LDL Cholesterol: 119 mg/dL — ABNORMAL HIGH (ref 0–99)
NonHDL: 147.56
Total CHOL/HDL Ratio: 3
Triglycerides: 143 mg/dL (ref 0.0–149.0)
VLDL: 28.6 mg/dL (ref 0.0–40.0)

## 2018-12-10 LAB — TSH: TSH: 1.91 u[IU]/mL (ref 0.35–4.50)

## 2018-12-10 MED ORDER — SIMVASTATIN 20 MG PO TABS: 20.0000 mg | ORAL_TABLET | Freq: Every day | ORAL | 3 refills | Status: DC

## 2018-12-10 MED ORDER — NORGESTIM-ETH ESTRAD TRIPHASIC 0.18/0.215/0.25 MG-35 MCG PO TABS: 1.0000 | ORAL_TABLET | Freq: Every day | ORAL | 4 refills | Status: DC

## 2018-12-10 NOTE — Assessment & Plan Note (Signed)
Pap done with gc/chlam/ trich screening and HPV Has had a new partner and desires STD screen No c/o Still regular menses on OC -but lighter  Should be menopausal soon- can try going off of it at any time to see if menses stops  Condom use urged

## 2018-12-10 NOTE — Assessment & Plan Note (Signed)
Commended on wt loss so far with diet/exercise   Discussed how this problem influences overall health and the risks it imposes  Reviewed plan for weight loss with lower calorie diet (via better food choices and also portion control or program like weight watchers) and exercise building up to or more than 30 minutes 5 days per week including some aerobic activity

## 2018-12-10 NOTE — Progress Notes (Signed)
Subjective:    Patient ID: Katherine Schwartz, female    DOB: Oct 23, 1965, 53 y.o.   MRN: [id number]  HPI  Here for health maintenance exam and to review chronic medical problems   Working from home  Feeling ok overall   Had her kidney stone procedure  Finally passed it all  Continues to follow up with urology    Wt Readings from Last 3 Encounters:  12/10/18 183 lb (83 kg)  03/13/18 193 lb (87.5 kg)  02/26/18 192 lb 9.6 oz (87.4 kg)  she has been more active-increasing steps daily  Also eating healthy most of the time / counts calories  33.47 kg/m   BP Readings from Last 3 Encounters:  12/10/18 118/82  03/13/18 135/85  02/26/18 113/67   Pulse Readings from Last 3 Encounters:  12/10/18 82  03/13/18 90  02/26/18 80   Flu vaccine - had it in august   Mammogram 7/20  Self breast exam no lumps or changes   Pap 11/18 neg with neg HPV Gyn care- has not been back to the gyn  (had appt that was cancelled)  Not menopausal yet-still having (3-4 days-not too heavy)  Has a fibroid - hoping it will shrink at menopause  Has not skipped menses Still on ortho tri cyclen   Has more irritability with hormone change   Wants to do pap/had a new partner  Had trichomonas in the past- was treated   Colonoscopy 9/16  Tdap 10/17   Zoster status -interested in shingrix   Neg Hp C and HIV screens in the past Interested in STD check    Hyperlipidemia Lab Results  Component Value Date   CHOL 258 (H) 12/05/2017   HDL 66.70 12/05/2017   LDLCALC 80 11/26/2016   LDLDIRECT 172.0 12/05/2017   TRIG 208.0 (H) 12/05/2017   CHOLHDL 4 12/05/2017   Due for labs today  Taking simvastatin   Patient Active Problem List   Diagnosis Date Noted  . Uterine fibroid 12/03/2016  . Screen for STD (sexually transmitted disease) 12/03/2016  . Special screening for malignant neoplasms, colon 06/07/2014  . Encounter for repeat Pap smear due to previous insufficient cervical cells 08/14/2012  .  Encounter for routine gynecological examination 06/28/2011  . Routine general medical examination at a health care facility 06/26/2010  . Obesity 08/03/2007  . Hyperlipidemia, unspecified 05/01/2007  . ALLERGIC RHINITIS 05/01/2007  . 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 Acne   . Allergy    allergic rhinitis  . Hyperlipidemia   . Obesity    Past Surgical History:  Procedure Laterality Date  . CESAREAN SECTION  1994  . EXTRACORPOREAL SHOCK WAVE LITHOTRIPSY Left 02/26/2018   Procedure: EXTRACORPOREAL SHOCK WAVE LITHOTRIPSY (ESWL);  Surgeon: Hollice Espy, MD;  Location: [facility] ORS;  Service: Urology;  Laterality: Left;   Social History   Tobacco Use  . Smoking status: Never Smoker  . Smokeless tobacco: Never Used  Substance Use Topics  . Alcohol use: Yes    Alcohol/week: 0.0 standard drinks    Comment: socially, rare  . Drug use: No   Family History  Problem Relation Age of Onset  . Colon cancer Maternal Grandmother 58  . Hypertension Mother   . Hyperlipidemia Mother   . Cancer Maternal Aunt        cervical/ovarian CA  . Cancer Paternal Aunt        breast CA  . Esophageal cancer Neg Hx   .  Stomach cancer Neg Hx   . Rectal cancer Neg Hx   . Bladder Cancer Neg Hx   . Kidney cancer Neg Hx   . Breast cancer Neg Hx    Allergies  Allergen Reactions  . Other Nausea And Vomiting    PINE NUTS IN PESTO    Current Outpatient Medications on File Prior to Visit  Medication Sig Dispense Refill  . cetirizine-pseudoephedrine (ZYRTEC-D ALLERGY & CONGESTION) 5-120 MG per tablet Take 1 tablet by mouth daily.       No current facility-administered medications on file prior to visit.       Review of Systems  Constitutional: Negative for activity change, appetite change, fatigue, fever and unexpected weight change.  HENT: Negative for congestion, ear pain, rhinorrhea, sinus pressure and sore throat.   Eyes: Negative for pain, redness and visual  disturbance.  Respiratory: Negative for cough, shortness of breath and wheezing.   Cardiovascular: Negative for chest pain and palpitations.  Gastrointestinal: Negative for abdominal pain, blood in stool, constipation and diarrhea.  Endocrine: Negative for polydipsia and polyuria.  Genitourinary: Negative for dysuria, frequency and urgency.  Musculoskeletal: Negative for arthralgias, back pain and myalgias.  Skin: Negative for pallor and rash.  Allergic/Immunologic: Negative for environmental allergies.  Neurological: Negative for dizziness, syncope and headaches.  Hematological: Negative for adenopathy. Does not bruise/bleed easily.  Psychiatric/Behavioral: Negative for decreased concentration and dysphoric mood. The patient is not nervous/anxious.        Objective:   Physical Exam Constitutional:      General: She is not in acute distress.    Appearance: Normal appearance. She is well-developed. She is obese. She is not ill-appearing or diaphoretic.  HENT:     Head: Normocephalic and atraumatic.     Right Ear: Tympanic membrane, ear canal and external ear normal.     Left Ear: Tympanic membrane, ear canal and external ear normal.     Nose: Nose normal. No congestion.     Mouth/Throat:     Mouth: Mucous membranes are moist.     Pharynx: Oropharynx is clear. No posterior oropharyngeal erythema.  Eyes:     General: No scleral icterus.    Extraocular Movements: Extraocular movements intact.     Conjunctiva/sclera: Conjunctivae normal.     Pupils: Pupils are equal, round, and reactive to light.  Neck:     Musculoskeletal: Normal range of motion and neck supple. No neck rigidity or muscular tenderness.     Thyroid: No thyromegaly.     Vascular: No carotid bruit or JVD.  Cardiovascular:     Rate and Rhythm: Normal rate and regular rhythm.     Pulses: Normal pulses.     Heart sounds: Normal heart sounds. No gallop.   Pulmonary:     Effort: Pulmonary effort is normal. No  respiratory distress.     Breath sounds: Normal breath sounds. No wheezing.     Comments: Good air exch Chest:     Chest wall: No tenderness.  Abdominal:     General: Bowel sounds are normal. There is no distension or abdominal bruit.     Palpations: Abdomen is soft. There is no mass.     Tenderness: There is no abdominal tenderness.     Hernia: No hernia is present.  Genitourinary:    Comments: Breast exam: No mass, nodules, thickening, tenderness, bulging, retraction, inflamation, nipple discharge or skin changes noted.  No axillary or clavicular LA.     Musculoskeletal: Normal range of motion.  General: No tenderness.     Right lower leg: No edema.     Left lower leg: No edema.  Lymphadenopathy:     Cervical: No cervical adenopathy.  Skin:    General: Skin is warm and dry.     Coloration: Skin is not pale.     Findings: No erythema or rash.     Comments: Few skin tags   Tiny pale areas diffusely on legs consistent with guttate hypopigmentosis   Neurological:     Mental Status: She is alert. Mental status is at baseline.     Cranial Nerves: No cranial nerve deficit.     Motor: No abnormal muscle tone.     Coordination: Coordination normal.     Gait: Gait normal.     Deep Tendon Reflexes: Reflexes are normal and symmetric. Reflexes normal.  Psychiatric:        Mood and Affect: Mood normal.        Cognition and Memory: Cognition and memory normal.           Assessment & Plan:   Problem List Items Addressed This Visit      Genitourinary   Uterine fibroid    Pt chooses to monitor until menopause  Not causing problems  On OC for menses control        Other   Hyperlipidemia, unspecified    Lipid panel today  Doing better with her diet Disc goals for lipids and reasons to control them Rev last labs with pt Rev low sat fat diet in detail  Continues simvastatin       Relevant Medications   simvastatin (ZOCOR) 20 MG tablet   Other Relevant Orders    Lipid panel   Obesity    Commended on wt loss so far with diet/exercise   Discussed how this problem influences overall health and the risks it imposes  Reviewed plan for weight loss with lower calorie diet (via better food choices and also portion control or program like weight watchers) and exercise building up to or more than 30 minutes 5 days per week including some aerobic activity         Routine general medical examination at a health care facility - Primary    Reviewed health habits including diet and exercise and skin cancer prevention Reviewed appropriate screening tests for age  Also reviewed health mt list, fam hx and immunization status , as well as social and family history   See HPI Labs ordered STD screen done Gyn exam and pap done  Counseled re: expectations for menopause  Commended on wt loss nad good health habits       Relevant Orders   CBC w/Diff   Comprehensive metabolic panel   Lipid panel   TSH   Encounter for routine gynecological examination    Pap done with gc/chlam/ trich screening and HPV Has had a new partner and desires STD screen No c/o Still regular menses on OC -but lighter  Should be menopausal soon- can try going off of it at any time to see if menses stops  Condom use urged       Relevant Orders   Cytology - PAP([facility])   Screen for STD (sexually transmitted disease)    HIV and RPR with labs Gc/chlam/trich ordered with pap as well as HPV screen  No known contacts or symptoms       Relevant Orders   HIV antibody (with reflex)   RPR

## 2018-12-10 NOTE — Assessment & Plan Note (Signed)
Reviewed health habits including diet and exercise and skin cancer prevention Reviewed appropriate screening tests for age  Also reviewed health mt list, fam hx and immunization status , as well as social and family history   See HPI Labs ordered STD screen done Gyn exam and pap done  Counseled re: expectations for menopause  Commended on wt loss nad good health habits

## 2018-12-10 NOTE — Assessment & Plan Note (Signed)
HIV and RPR with labs Gc/chlam/trich ordered with pap as well as HPV screen  No known contacts or symptoms

## 2018-12-10 NOTE — Assessment & Plan Note (Addendum)
Lipid panel today  Doing better with her diet Disc goals for lipids and reasons to control them Rev last labs with pt Rev low sat fat diet in detail  Continues simvastatin

## 2018-12-10 NOTE — Patient Instructions (Addendum)
If you are interested in the shingles vaccine series (Shingrix), call your insurance or pharmacy to check on coverage and location it must be given.  If affordable - you can schedule it here or at your pharmacy depending on coverage    You can try stopping your oral contraceptive any time and see if period stops   Labs today   Pap today   Take care of yourself !

## 2018-12-10 NOTE — Assessment & Plan Note (Signed)
Pt chooses to monitor until menopause  Not causing problems  On OC for menses control

## 2018-12-11 LAB — HIV ANTIBODY (ROUTINE TESTING W REFLEX): HIV 1&2 Ab, 4th Generation: NONREACTIVE

## 2018-12-11 LAB — RPR: RPR Ser Ql: NONREACTIVE

## 2018-12-14 LAB — CYTOLOGY - PAP
Adequacy: ABSENT
Chlamydia: NEGATIVE
Comment: NEGATIVE
Comment: NEGATIVE
Comment: NEGATIVE
Comment: NORMAL
Diagnosis: NEGATIVE
High risk HPV: POSITIVE — AB
Neisseria Gonorrhea: NEGATIVE
Trichomonas: NEGATIVE

## 2018-12-15 ENCOUNTER — Encounter: Payer: Self-pay | Admitting: *Deleted

## 2019-02-09 DIAGNOSIS — Z20828 Contact with and (suspected) exposure to other viral communicable diseases: Secondary | ICD-10-CM | POA: Diagnosis not present

## 2019-06-08 NOTE — Progress Notes (Signed)
06/09/19 9:43 PM   Katherine Schwartz 17-May-1965 [id number]  Referring provider: Abner Greenspan, MD [place],  [place] [id number] No chief complaint on file.   HPI: Katherine Schwartz is a 54 y.o. F with a personal history of nephrolithiasis who underwent shockwave ESWL 02/26/18 for an 11 mm left-sided nonobstructing stone.  KUB from 06/09/18 shows no additional ureteral or kidney stones.  Previous KUB, there was concern of a 3 mm left upper pole fragment which was not appreciated today.  KUB today reveals no significant stone burden.   No GU complaints today. She has been drinking water and eating kale. She does not eat as much spinach. Denies gross hematuria or flank pain.   PMH: Past Medical History:  Diagnosis Date  . Acne   . Allergy    allergic rhinitis  . Hyperlipidemia   . Obesity     Surgical History: Past Surgical History:  Procedure Laterality Date  . CESAREAN SECTION  1994  . EXTRACORPOREAL SHOCK WAVE LITHOTRIPSY Left 02/26/2018   Procedure: EXTRACORPOREAL SHOCK WAVE LITHOTRIPSY (ESWL);  Surgeon: Hollice Espy, MD;  Location: [facility] ORS;  Service: Urology;  Laterality: Left;    Home Medications:  Allergies as of 06/09/2019      Reactions   Other Nausea And Vomiting   PINE NUTS IN PESTO       Medication List       Accurate as of Jun 08, 2019  9:43 PM. If you have any questions, ask your nurse or doctor.        Norgestimate-Ethinyl Estradiol Triphasic 0.18/0.215/0.25 MG-35 MCG tablet Commonly known as: Ortho Tri-Cyclen (28) Take 1 tablet by mouth daily.   simvastatin 20 MG tablet Commonly known as: ZOCOR Take 1 tablet (20 mg total) by mouth at bedtime.   ZyrTEC-D Allergy & Congestion 5-120 MG tablet Generic drug: cetirizine-pseudoephedrine Take 1 tablet by mouth daily.       Allergies:  Allergies  Allergen Reactions  . Other Nausea And Vomiting    PINE NUTS IN PESTO     Family History: Family History  Problem Relation  Age of Onset  . Colon cancer Maternal Grandmother 32  . Hypertension Mother   . Hyperlipidemia Mother   . Cancer Maternal Aunt        cervical/ovarian CA  . Cancer Paternal Aunt        breast CA  . Esophageal cancer Neg Hx   . Stomach cancer Neg Hx   . Rectal cancer Neg Hx   . Bladder Cancer Neg Hx   . Kidney cancer Neg Hx   . Breast cancer Neg Hx     Social History:  reports that she has never smoked. She has never used smokeless tobacco. She reports current alcohol use. She reports that she does not use drugs.   Physical Exam: There were no vitals taken for this visit.  Constitutional:  Alert and oriented, No acute distress. HEENT: [place] AT, moist mucus membranes.  Trachea midline, no masses. Cardiovascular: No clubbing, cyanosis, or edema. Respiratory: Normal respiratory effort, no increased work of breathing. Skin: No rashes, bruises or suspicious lesions. Neurologic: Grossly intact, no focal deficits, moving all 4 extremities. Psychiatric: Normal mood and affect.  Pertinent Imaging: KUB today unremarkable. Please see Epic.   Assessment & Plan:    1. Nephrolithiasis  Asymptomatic  KUB today revealed no significant stone burden  We discussed general stone prevention techniques including drinking plenty water with goal of producing  2.5 L urine daily, increased citric acid intake, avoidance of high oxalate containing foods, and decreased salt intake.  Information about dietary recommendations given today.  F/u prn    Katherine [address] [place], Katherine Schwartz [address] [phone]  I, Lucas Mallow, am acting as a scribe for Dr. Hollice Espy,  I have reviewed the above documentation for accuracy and completeness, and I agree with the above.   Hollice Espy, MD

## 2019-06-09 ENCOUNTER — Other Ambulatory Visit: Payer: Self-pay

## 2019-06-09 ENCOUNTER — Encounter: Payer: Self-pay | Admitting: Urology

## 2019-06-09 ENCOUNTER — Ambulatory Visit (INDEPENDENT_AMBULATORY_CARE_PROVIDER_SITE_OTHER): Payer: BC Managed Care – PPO | Admitting: Urology

## 2019-06-09 ENCOUNTER — Ambulatory Visit
Admission: RE | Admit: 2019-06-09 | Discharge: 2019-06-09 | Disposition: A | Discharge status: Home / Self Care | Payer: BC Managed Care – PPO | Source: Ambulatory Visit | Attending: Urology | Admitting: Urology

## 2019-06-09 VITALS — BP 173/109 | HR 84

## 2019-06-09 DIAGNOSIS — Z87442 Personal history of urinary calculi: Secondary | ICD-10-CM

## 2019-06-09 DIAGNOSIS — N200 Calculus of kidney: Secondary | ICD-10-CM | POA: Diagnosis not present

## 2019-06-16 ENCOUNTER — Other Ambulatory Visit: Payer: Self-pay | Admitting: Family Medicine

## 2019-06-16 DIAGNOSIS — Z1231 Encounter for screening mammogram for malignant neoplasm of breast: Secondary | ICD-10-CM

## 2019-07-05 ENCOUNTER — Encounter: Payer: Self-pay | Admitting: Family Medicine

## 2019-07-05 ENCOUNTER — Other Ambulatory Visit: Payer: Self-pay

## 2019-07-05 ENCOUNTER — Ambulatory Visit (INDEPENDENT_AMBULATORY_CARE_PROVIDER_SITE_OTHER): Payer: BC Managed Care – PPO | Admitting: Family Medicine

## 2019-07-05 VITALS — BP 144/92 | Temp 98.7°F | Ht 62.0 in | Wt 188.0 lb

## 2019-07-05 DIAGNOSIS — M79661 Pain in right lower leg: Secondary | ICD-10-CM | POA: Diagnosis not present

## 2019-07-05 NOTE — Assessment & Plan Note (Signed)
Exam more consistent with muscle strain - improved with stretching, massage, reassuring exam w/o swelling or homan's sign. Though discussed risk factors for blood clot - recent travel and birth control. Discussed getting D-dimer to rule out blood clot vs watch and wait and she will watch and wait. Red flag signs discussed and she is to call if these develop.

## 2019-07-05 NOTE — Progress Notes (Signed)
   Subjective:     Katherine Schwartz is a 54 y.o. female presenting for Leg Pain (right side, started 2 weeks ago after flight)     HPI  #Right knee pain - on the anterior potion - also having some thigh pain - hx of right knee pain - and will get soreness when she goes upstairs - still takes birth control and symptoms started after traveling to [place] - no rigorous activity or injury  - was limping when she landed - thinks her thigh is slightly swollen   Has returned to zumba classes and doing well No tachycardia or sob  Review of Systems   Social History   Tobacco Use  Smoking Status Never Smoker  Smokeless Tobacco Never Used        Objective:    BP Readings from Last 3 Encounters:  07/05/19 (!) 144/92  06/09/19 (!) 173/109  12/10/18 118/82   Wt Readings from Last 3 Encounters:  07/05/19 188 lb (85.3 kg)  12/10/18 183 lb (83 kg)  03/13/18 193 lb (87.5 kg)    BP (!) 144/92 (Cuff Size: Large)   Temp 98.7 F (37.1 C) (Temporal)   Ht 5\' 2"  (1.575 m)   Wt 188 lb (85.3 kg)   LMP 06/05/2019   BMI 34.39 kg/m    Physical Exam Constitutional:      General: She is not in acute distress.    Appearance: She is well-developed. She is not diaphoretic.  HENT:     Right Ear: External ear normal.     Left Ear: External ear normal.  Eyes:     Conjunctiva/sclera: Conjunctivae normal.  Cardiovascular:     Rate and Rhythm: Normal rate.  Pulmonary:     Effort: Pulmonary effort is normal.  Musculoskeletal:     Cervical back: Neck supple.     Right lower leg: No edema.     Left lower leg: No edema.     Comments: Normal knee range of motion. No TTP to the calf or thigh area. Normal LE strength testing. Negative Homan's sign  Skin:    General: Skin is warm and dry.     Capillary Refill: Capillary refill takes less than 2 seconds.  Neurological:     Mental Status: She is alert. Mental status is at baseline.  Psychiatric:        Mood and Affect: Mood normal.          Behavior: Behavior normal.           Assessment & Plan:   Problem List Items Addressed This Visit      Other   Pain in right lower leg - Primary    Exam more consistent with muscle strain - improved with stretching, massage, reassuring exam w/o swelling or homan's sign. Though discussed risk factors for blood clot - recent travel and birth control. Discussed getting D-dimer to rule out blood clot vs watch and wait and she will watch and wait. Red flag signs discussed and she is to call if these develop.           Return if symptoms worsen or fail to improve.  Lesleigh Noe, MD

## 2019-07-05 NOTE — Patient Instructions (Signed)
Likely a muscle strain - continue to stretch and massage - for knee pain - strengthen your glutes and quadracepts muscles  If you develop fast heart rate, breathing difficulty or leg swelling or worsening leg pain call back  Enough reassuring symptoms to no do work-up for blood clot, but we can get a D-dimer if you are not continuing to improve

## 2019-08-04 ENCOUNTER — Ambulatory Visit
Admission: RE | Admit: 2019-08-04 | Discharge: 2019-08-04 | Disposition: A | Discharge status: Home / Self Care | Payer: BC Managed Care – PPO | Source: Ambulatory Visit | Attending: Family Medicine | Admitting: Family Medicine

## 2019-08-04 ENCOUNTER — Other Ambulatory Visit: Payer: Self-pay

## 2019-08-04 DIAGNOSIS — Z1231 Encounter for screening mammogram for malignant neoplasm of breast: Secondary | ICD-10-CM | POA: Diagnosis not present

## 2019-12-13 ENCOUNTER — Other Ambulatory Visit (HOSPITAL_COMMUNITY)
Admission: RE | Admit: 2019-12-13 | Discharge: 2019-12-13 | Disposition: A | Discharge status: Home / Self Care | Payer: BC Managed Care – PPO | Source: Ambulatory Visit | Attending: Family Medicine | Admitting: Family Medicine

## 2019-12-13 ENCOUNTER — Other Ambulatory Visit: Payer: Self-pay

## 2019-12-13 ENCOUNTER — Ambulatory Visit (INDEPENDENT_AMBULATORY_CARE_PROVIDER_SITE_OTHER): Payer: BC Managed Care – PPO | Admitting: Family Medicine

## 2019-12-13 ENCOUNTER — Encounter: Payer: Self-pay | Admitting: Family Medicine

## 2019-12-13 VITALS — BP 138/78 | HR 89 | Temp 96.9°F | Ht 62.5 in | Wt 192.6 lb

## 2019-12-13 DIAGNOSIS — Z0000 Encounter for general adult medical examination without abnormal findings: Secondary | ICD-10-CM

## 2019-12-13 DIAGNOSIS — E7800 Pure hypercholesterolemia, unspecified: Secondary | ICD-10-CM

## 2019-12-13 DIAGNOSIS — D259 Leiomyoma of uterus, unspecified: Secondary | ICD-10-CM

## 2019-12-13 DIAGNOSIS — Z6834 Body mass index (BMI) 34.0-34.9, adult: Secondary | ICD-10-CM

## 2019-12-13 DIAGNOSIS — Z01419 Encounter for gynecological examination (general) (routine) without abnormal findings: Secondary | ICD-10-CM | POA: Insufficient documentation

## 2019-12-13 DIAGNOSIS — E6609 Other obesity due to excess calories: Secondary | ICD-10-CM | POA: Diagnosis not present

## 2019-12-13 LAB — LIPID PANEL
Cholesterol: 206 mg/dL — ABNORMAL HIGH (ref 0–200)
HDL: 64.4 mg/dL (ref >39.00)
LDL Cholesterol: 109 mg/dL — ABNORMAL HIGH (ref 0–99)
NonHDL: 141.87
Total CHOL/HDL Ratio: 3
Triglycerides: 165 mg/dL — ABNORMAL HIGH (ref 0.0–149.0)
VLDL: 33 mg/dL (ref 0.0–40.0)

## 2019-12-13 LAB — COMPREHENSIVE METABOLIC PANEL
ALT: 15 U/L (ref 0–35)
AST: 16 U/L (ref 0–37)
Albumin: 4 g/dL (ref 3.5–5.2)
Alkaline Phosphatase: 51 U/L (ref 39–117)
BUN: 11 mg/dL (ref 6–23)
CO2: 27 mEq/L (ref 19–32)
Calcium: 9.3 mg/dL (ref 8.4–10.5)
Chloride: 104 mEq/L (ref 96–112)
Creatinine, Ser: 0.63 mg/dL (ref 0.40–1.20)
GFR: 99.35 mL/min (ref >60.00)
Glucose, Bld: 75 mg/dL (ref 70–99)
Potassium: 3.7 mEq/L (ref 3.5–5.1)
Sodium: 140 mEq/L (ref 135–145)
Total Bilirubin: 0.5 mg/dL (ref 0.2–1.2)
Total Protein: 6.9 g/dL (ref 6.0–8.3)

## 2019-12-13 LAB — CBC WITH DIFFERENTIAL/PLATELET
Basophils Absolute: 0 10*3/uL (ref 0.0–0.1)
Basophils Relative: 0.4 % (ref 0.0–3.0)
Eosinophils Absolute: 0.1 10*3/uL (ref 0.0–0.7)
Eosinophils Relative: 1.7 % (ref 0.0–5.0)
HCT: 39.4 % (ref 36.0–46.0)
Hemoglobin: 13.3 g/dL (ref 12.0–15.0)
Lymphocytes Relative: 18 % (ref 12.0–46.0)
Lymphs Abs: 0.9 10*3/uL (ref 0.7–4.0)
MCHC: 33.7 g/dL (ref 30.0–36.0)
MCV: 95.4 fl (ref 78.0–100.0)
Monocytes Absolute: 0.4 10*3/uL (ref 0.1–1.0)
Monocytes Relative: 7.5 % (ref 3.0–12.0)
Neutro Abs: 3.6 10*3/uL (ref 1.4–7.7)
Neutrophils Relative %: 72.4 % (ref 43.0–77.0)
Platelets: 223 10*3/uL (ref 150.0–400.0)
RBC: 4.13 Mil/uL (ref 3.87–5.11)
RDW: 12.6 % (ref 11.5–15.5)
WBC: 4.9 10*3/uL (ref 4.0–10.5)

## 2019-12-13 LAB — TSH: TSH: 2.46 u[IU]/mL (ref 0.35–4.50)

## 2019-12-13 MED ORDER — SIMVASTATIN 20 MG PO TABS: 20.0000 mg | ORAL_TABLET | Freq: Every day | ORAL | 3 refills | Status: DC

## 2019-12-13 NOTE — Patient Instructions (Addendum)
If you are interested in the shingles vaccine series (Shingrix), call your insurance or pharmacy to check on coverage and location it must be given.  If affordable - you can schedule it here or at your pharmacy depending on coverage   Go ahead and stop the oral contraceptive  Let me know how it goes  If heavy bleeding or if period persists for more than 3 months let me know   Pap done today   Take care of yourself  Keep exercising

## 2019-12-13 NOTE — Assessment & Plan Note (Signed)
Pap and exam done (HPV last time) Declines need for STD screen No clinical changes-still having light menses on OC  Will plan to stop that now as she is over 3  Will watch-if menses continue will let us know

## 2019-12-13 NOTE — Assessment & Plan Note (Signed)
Reviewed health habits including diet and exercise and skin cancer prevention Reviewed appropriate screening tests for age  Also reviewed health mt list, fam hx and immunization status , as well as social and family history   See HPI Labs ordered  Pap/gyn exam done  Discussed shingrix vaccine-interested if covered  Enc to continue exercise  covid immunized

## 2019-12-13 NOTE — Progress Notes (Signed)
Subjective:    Patient ID: Katherine Schwartz, female    DOB: Aug 05, 1965, 54 y.o.   MRN: [id number]  This visit occurred during the SARS-CoV-2 public health emergency.  Safety protocols were in place, including screening questions prior to the visit, additional usage of staff PPE, and extensive cleaning of exam room while observing appropriate contact time as indicated for disinfecting solutions.    HPI Here for health maintenance exam and to review chronic medical problems    Wt Readings from Last 3 Encounters:  12/13/19 192 lb 9 oz (87.3 kg)  07/05/19 188 lb (85.3 kg)  12/10/18 183 lb (83 kg)   34.66 kg/m  New job-insurance  Being trained   Had some joint pain/that is better   Pap 11/20 -neg but HPV positive (needs 1 y f/u)  Has h/o uterine fibroid  [facility] tri cyclen  Still having periods on the pill    Does not desire std screen   Mammogram 7/21 Self breast exam - no lumps   Colonoscopy 9/16  Tdap 10/17 Flu shot 8/21 covid imm-pfizer in May  Zoster status -interested in shingrix   BP Readings from Last 3 Encounters:  12/13/19 138/78  07/05/19 (!) 144/92  06/09/19 (!) 173/109   Pulse Readings from Last 3 Encounters:  12/13/19 89  06/09/19 84  12/10/18 82   Increased her walking/ more active  Also training  Trying not to eat out as much   Hyperlipidemia  due to for labs Lab Results  Component Value Date   CHOL 217 (H) 12/10/2018   HDL 69.80 12/10/2018   LDLCALC 119 (H) 12/10/2018   LDLDIRECT 172.0 12/05/2017   TRIG 143.0 12/10/2018   CHOLHDL 3 12/10/2018   Takes simvastatin 20 mg daily   Patient Active Problem List   Diagnosis Date Noted   Uterine fibroid 12/03/2016   Screen for STD (sexually transmitted disease) 12/03/2016   Special screening for malignant neoplasms, colon 06/07/2014   Encounter for repeat Pap smear due to previous insufficient cervical cells 08/14/2012   Encounter for routine gynecological examination 06/28/2011    Routine general medical examination at a health care facility 06/26/2010   Obesity 08/03/2007   Hyperlipidemia, unspecified 05/01/2007   ALLERGIC RHINITIS 05/01/2007   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   Past Medical History:  Diagnosis Date   Acne    Allergy    allergic rhinitis   Hyperlipidemia    Obesity    Past Surgical History:  Procedure Laterality Date   [place] LITHOTRIPSY Left 02/26/2018   Procedure: EXTRACORPOREAL SHOCK WAVE LITHOTRIPSY (ESWL);  Surgeon: Hollice Espy, MD;  Location: [facility];  Service: Urology;  Laterality: Left;   Social History   Tobacco Use   Smoking status: Never Smoker   Smokeless tobacco: Never Used  Substance Use Topics   Alcohol use: Yes    Alcohol/week: 0.0 standard drinks    Comment: socially, rare   Drug use: No   Family History  Problem Relation Age of Onset   Colon cancer Maternal Grandmother 54   Hypertension Mother    Hyperlipidemia Mother    Cancer Maternal Aunt        cervical/ovarian CA   Cancer Paternal Aunt        breast CA   Esophageal cancer Neg Hx    Stomach cancer Neg Hx    Rectal cancer Neg Hx    Bladder Cancer Neg Hx    Kidney cancer Neg Hx  Breast cancer Neg Hx    Allergies  Allergen Reactions   Lavender Oil Hives   Other Nausea And Vomiting    PINE NUTS IN PESTO    Current Outpatient Medications on File Prior to Visit  Medication Sig Dispense Refill   cetirizine-pseudoephedrine (ZYRTEC-D ALLERGY & CONGESTION) 5-120 MG per tablet Take 1 tablet by mouth daily.       No current facility-administered medications on file prior to visit.    Review of Systems  Constitutional: Negative for activity change, appetite change, fatigue, fever and unexpected weight change.  HENT: Negative for congestion, ear pain, rhinorrhea, sinus pressure and sore throat.        Allergy/nasal symptoms off and on  Eyes: Negative for pain, redness and visual  disturbance.  Respiratory: Negative for cough, shortness of breath and wheezing.   Cardiovascular: Negative for chest pain and palpitations.  Gastrointestinal: Negative for abdominal pain, blood in stool, constipation and diarrhea.  Endocrine: Negative for polydipsia and polyuria.  Genitourinary: Negative for dysuria, frequency and urgency.  Musculoskeletal: Negative for arthralgias, back pain and myalgias.  Skin: Negative for pallor and rash.  Allergic/Immunologic: Negative for environmental allergies.  Neurological: Negative for dizziness, syncope and headaches.  Hematological: Negative for adenopathy. Does not bruise/bleed easily.  Psychiatric/Behavioral: Negative for decreased concentration and dysphoric mood. The patient is not nervous/anxious.        Objective:   Physical Exam Constitutional:      General: She is not in acute distress.    Appearance: Normal appearance. She is well-developed. She is obese. She is not ill-appearing or diaphoretic.  HENT:     Head: Normocephalic and atraumatic.     Right Ear: Tympanic membrane, ear canal and external ear normal.     Left Ear: Tympanic membrane, ear canal and external ear normal.     Nose: Nose normal. No congestion.     Mouth/Throat:     Mouth: Mucous membranes are moist.     Pharynx: Oropharynx is clear. No posterior oropharyngeal erythema.  Eyes:     General: No scleral icterus.    Extraocular Movements: Extraocular movements intact.     Conjunctiva/sclera: Conjunctivae normal.     Pupils: Pupils are equal, round, and reactive to light.  Neck:     Thyroid: No thyromegaly.     Vascular: No carotid bruit or JVD.  Cardiovascular:     Rate and Rhythm: Normal rate and regular rhythm.     Pulses: Normal pulses.     Heart sounds: Normal heart sounds. No gallop.   Pulmonary:     Effort: Pulmonary effort is normal. No respiratory distress.     Breath sounds: Normal breath sounds. No wheezing.     Comments: Good air exch Chest:      Chest wall: No tenderness.  Abdominal:     General: Bowel sounds are normal. There is no distension or abdominal bruit.     Palpations: Abdomen is soft. There is no mass.     Tenderness: There is no abdominal tenderness.     Hernia: No hernia is present.  Genitourinary:    Comments: Breast exam: No mass, nodules, thickening, tenderness, bulging, retraction, inflamation, nipple discharge or skin changes noted.  No axillary or clavicular LA.              Anus appears normal w/o hemorrhoids or masses     External genitalia : nl appearance and hair distribution/no lesions     Urethral meatus : nl size, no  lesions or prolapse     Urethra: no masses, tenderness or scarring    Bladder : no masses or tenderness     Vagina: nl general appearance, no discharge or  Lesions, no significant cystocele  or rectocele     Cervix: no lesions/ discharge or friability    Uterus: nl position, and mobility (not fixed) , non tender   Exam consistent with fibroid    Adnexa : no masses, tenderness, enlargement or nodularity       Musculoskeletal:        General: No tenderness. Normal range of motion.     Cervical back: Normal range of motion and neck supple. No rigidity. No muscular tenderness.     Right lower leg: No edema.     Left lower leg: No edema.  Lymphadenopathy:     Cervical: No cervical adenopathy.  Skin:    General: Skin is warm and dry.     Coloration: Skin is not pale.     Findings: No erythema or rash.  Neurological:     Mental Status: She is alert. Mental status is at baseline.     Cranial Nerves: No cranial nerve deficit.     Motor: No abnormal muscle tone.     Coordination: Coordination normal.     Gait: Gait normal.     Deep Tendon Reflexes: Reflexes are normal and symmetric. Reflexes normal.  Psychiatric:        Mood and Affect: Mood normal.        Cognition and Memory: Cognition and memory normal.           Assessment & Plan:   Problem List  Items Addressed This Visit      Genitourinary   Uterine fibroid    No symptoms         Other   Hyperlipidemia, unspecified   Relevant Medications   simvastatin (ZOCOR) 20 MG tablet   Other Relevant Orders   Lipid panel (Completed)   Obesity   Routine general medical examination at a health care facility - Primary    Reviewed health habits including diet and exercise and skin cancer prevention Reviewed appropriate screening tests for age  Also reviewed health mt list, fam hx and immunization status , as well as social and family history   See HPI Labs ordered  Pap/gyn exam done  Discussed shingrix vaccine-interested if covered  Enc to continue exercise  covid immunized        Relevant Orders   Comprehensive metabolic panel (Completed)   CBC with Differential/Platelet (Completed)   Lipid panel (Completed)   TSH (Completed)   Encounter for routine gynecological examination    Pap and exam done (HPV last time) Declines need for STD screen No clinical changes-still having light menses on OC  Will plan to stop that now as she is over 40  Will watch-if menses continue will let us know        Relevant Orders   Cytology - PAP(Hand)

## 2019-12-13 NOTE — Assessment & Plan Note (Signed)
No symptoms 

## 2019-12-17 LAB — CYTOLOGY - PAP
Diagnosis: NEGATIVE
Diagnosis: REACTIVE

## 2019-12-23 ENCOUNTER — Other Ambulatory Visit: Payer: Self-pay | Admitting: Family Medicine

## 2019-12-27 NOTE — Telephone Encounter (Signed)
Med d/c at CPE since pt is over 36

## 2020-06-14 ENCOUNTER — Other Ambulatory Visit: Payer: Self-pay | Admitting: Family Medicine

## 2020-07-10 ENCOUNTER — Other Ambulatory Visit: Payer: Self-pay | Admitting: Family Medicine

## 2020-07-18 ENCOUNTER — Other Ambulatory Visit: Payer: Self-pay | Admitting: Family Medicine

## 2020-07-18 DIAGNOSIS — Z1231 Encounter for screening mammogram for malignant neoplasm of breast: Secondary | ICD-10-CM

## 2020-09-13 ENCOUNTER — Ambulatory Visit
Admission: RE | Admit: 2020-09-13 | Discharge: 2020-09-13 | Disposition: A | Discharge status: Home / Self Care | Payer: BC Managed Care – PPO | Source: Ambulatory Visit | Attending: Family Medicine | Admitting: Family Medicine

## 2020-09-13 ENCOUNTER — Other Ambulatory Visit: Payer: Self-pay

## 2020-09-13 DIAGNOSIS — Z1231 Encounter for screening mammogram for malignant neoplasm of breast: Secondary | ICD-10-CM

## 2020-12-20 ENCOUNTER — Encounter: Payer: BC Managed Care – PPO | Admitting: Family Medicine

## 2020-12-23 ENCOUNTER — Other Ambulatory Visit: Payer: Self-pay | Admitting: Family Medicine

## 2020-12-27 ENCOUNTER — Encounter: Payer: BC Managed Care – PPO | Admitting: Family Medicine

## 2021-01-03 ENCOUNTER — Encounter: Payer: Self-pay | Admitting: Family Medicine

## 2021-01-03 ENCOUNTER — Other Ambulatory Visit: Payer: Self-pay

## 2021-01-03 ENCOUNTER — Ambulatory Visit (INDEPENDENT_AMBULATORY_CARE_PROVIDER_SITE_OTHER): Payer: BC Managed Care – PPO | Admitting: Family Medicine

## 2021-01-03 VITALS — BP 134/84 | HR 80 | Temp 98.0°F | Ht 63.25 in | Wt 196.5 lb

## 2021-01-03 DIAGNOSIS — Z0000 Encounter for general adult medical examination without abnormal findings: Secondary | ICD-10-CM

## 2021-01-03 DIAGNOSIS — E7800 Pure hypercholesterolemia, unspecified: Secondary | ICD-10-CM | POA: Diagnosis not present

## 2021-01-03 DIAGNOSIS — E6609 Other obesity due to excess calories: Secondary | ICD-10-CM | POA: Diagnosis not present

## 2021-01-03 DIAGNOSIS — Z6834 Body mass index (BMI) 34.0-34.9, adult: Secondary | ICD-10-CM

## 2021-01-03 LAB — CBC WITH DIFFERENTIAL/PLATELET
Basophils Absolute: 0 10*3/uL (ref 0.0–0.1)
Basophils Relative: 1.2 % (ref 0.0–3.0)
Eosinophils Absolute: 0.2 10*3/uL (ref 0.0–0.7)
Eosinophils Relative: 4.2 % (ref 0.0–5.0)
HCT: 40.1 % (ref 36.0–46.0)
Hemoglobin: 13.4 g/dL (ref 12.0–15.0)
Lymphocytes Relative: 30 % (ref 12.0–46.0)
Lymphs Abs: 1.1 10*3/uL (ref 0.7–4.0)
MCHC: 33.3 g/dL (ref 30.0–36.0)
MCV: 95 fl (ref 78.0–100.0)
Monocytes Absolute: 0.3 10*3/uL (ref 0.1–1.0)
Monocytes Relative: 8.2 % (ref 3.0–12.0)
Neutro Abs: 2 10*3/uL (ref 1.4–7.7)
Neutrophils Relative %: 56.4 % (ref 43.0–77.0)
Platelets: 216 10*3/uL (ref 150.0–400.0)
RBC: 4.22 Mil/uL (ref 3.87–5.11)
RDW: 13.3 % (ref 11.5–15.5)
WBC: 3.6 10*3/uL — ABNORMAL LOW (ref 4.0–10.5)

## 2021-01-03 LAB — COMPREHENSIVE METABOLIC PANEL
ALT: 27 U/L (ref 0–35)
AST: 22 U/L (ref 0–37)
Albumin: 4.2 g/dL (ref 3.5–5.2)
Alkaline Phosphatase: 85 U/L (ref 39–117)
BUN: 16 mg/dL (ref 6–23)
CO2: 30 mEq/L (ref 19–32)
Calcium: 10.2 mg/dL (ref 8.4–10.5)
Chloride: 106 mEq/L (ref 96–112)
Creatinine, Ser: 0.65 mg/dL (ref 0.40–1.20)
GFR: 97.87 mL/min (ref >60.00)
Glucose, Bld: 84 mg/dL (ref 70–99)
Potassium: 4 mEq/L (ref 3.5–5.1)
Sodium: 140 mEq/L (ref 135–145)
Total Bilirubin: 0.6 mg/dL (ref 0.2–1.2)
Total Protein: 7.5 g/dL (ref 6.0–8.3)

## 2021-01-03 LAB — LIPID PANEL
Cholesterol: 224 mg/dL — ABNORMAL HIGH (ref 0–200)
HDL: 68.5 mg/dL (ref >39.00)
LDL Cholesterol: 135 mg/dL — ABNORMAL HIGH (ref 0–99)
NonHDL: 155.71
Total CHOL/HDL Ratio: 3
Triglycerides: 106 mg/dL (ref 0.0–149.0)
VLDL: 21.2 mg/dL (ref 0.0–40.0)

## 2021-01-03 LAB — TSH: TSH: 1.41 u[IU]/mL (ref 0.35–5.50)

## 2021-01-03 MED ORDER — SIMVASTATIN 20 MG PO TABS: ORAL_TABLET | ORAL | 3 refills | Status: DC

## 2021-01-03 NOTE — Assessment & Plan Note (Signed)
Disc goals for lipids and reasons to control them Rev last labs with pt Rev low sat fat diet in detail Labs ordered today  Pt tolerates zocor 20 mg daily well  Also avoiding fried foods and red meat

## 2021-01-03 NOTE — Patient Instructions (Addendum)
Make a plan/schedule for exercise   If you are interested in the shingles vaccine series (Shingrix), call your insurance or pharmacy to check on coverage and location it must be given.  If affordable - you can schedule it here or at your pharmacy depending on coverage   Avoid red meat/ fried foods/ egg yolks/ fatty breakfast meats/ butter, cheese and high fat dairy/ and shellfish   Take care of yourself   Lab today

## 2021-01-03 NOTE — Assessment & Plan Note (Signed)
Reviewed health habits including diet and exercise and skin cancer prevention Reviewed appropriate screening tests for age  Also reviewed health mt list, fam hx and immunization status , as well as social and family history   See HPI Labs ordered Colonoscopy is utd Mammogram utd Pap utd  Interested in shingrix, enc to check on coverage  Doing well with menopausal change

## 2021-01-03 NOTE — Assessment & Plan Note (Signed)
Discussed how this problem influences overall health and the risks it imposes  Reviewed plan for weight loss with lower calorie diet (via better food choices and also portion control or program like weight watchers) and exercise building up to or more than 30 minutes 5 days per week including some aerobic activity   Plan made to add exercise to schedule

## 2021-01-03 NOTE — Progress Notes (Signed)
Subjective:    Patient ID: Katherine Schwartz, female    DOB: 08-19-1965, 55 y.o.   MRN: [id number]  This visit occurred during the SARS-CoV-2 public health emergency.  Safety protocols were in place, including screening questions prior to the visit, additional usage of staff PPE, and extensive cleaning of exam room while observing appropriate contact time as indicated for disinfecting solutions.   HPI Here for health maintenance exam and to review chronic medical problems    Wt Readings from Last 3 Encounters:  01/03/21 196 lb 8 oz (89.1 kg)  12/13/19 192 lb 9 oz (87.3 kg)  07/05/19 188 lb (85.3 kg)   34.53 kg/m Has been feeling ok  Was learning a new job-getting used to that   Self care is ok  Trying to get back to the gym- this is hard with her schedule  May work out in the house  R knee was bothering her - doing yoga stretches  More clumsy -worse balance   Colonoscopy 2016 normal  GM had colon cancer   Zoster status-interested in shingrix if covered  Covid immunized Tdap 10/17 Flu shot 09/2020  Pap 11/21-nl Menopause -no period for a year  Early this year had night sweats and poor sleep  Felt more tired than than usual- that is improved  No fibroid symptoms   Mammogram 8/22 Self breast exam- no lumps   Hyperlipidemia Lab Results  Component Value Date   CHOL 206 (H) 12/13/2019   HDL 64.40 12/13/2019   LDLCALC 109 (H) 12/13/2019   LDLDIRECT 172.0 12/05/2017   TRIG 165.0 (H) 12/13/2019   CHOLHDL 3 12/13/2019  Takes zocor 20 mg daily   Avoids fried foods Uses air fryer  Not a lot of red meat    Diet is variable  Needs more structured diet  Considering vegan diet in the future   Patient Active Problem List   Diagnosis Date Noted   Uterine fibroid 12/03/2016   Screen for STD (sexually transmitted disease) 12/03/2016   Special screening for malignant neoplasms, colon 06/07/2014   Encounter for repeat Pap smear due to previous insufficient cervical cells  08/14/2012   Encounter for routine gynecological examination 06/28/2011   Routine general medical examination at a health care facility 06/26/2010   Obesity 08/03/2007   Hyperlipidemia, unspecified 05/01/2007   ALLERGIC RHINITIS 05/01/2007   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   Past Medical History:  Diagnosis Date   Acne    Allergy    allergic rhinitis   Hyperlipidemia    Obesity    Past Surgical History:  Procedure Laterality Date   [place] LITHOTRIPSY Left 02/26/2018   Procedure: EXTRACORPOREAL SHOCK WAVE LITHOTRIPSY (ESWL);  Surgeon: Hollice Espy, MD;  Location: [facility];  Service: Urology;  Laterality: Left;   Social History   Tobacco Use   Smoking status: Never   Smokeless tobacco: Never  Substance Use Topics   Alcohol use: Yes    Alcohol/week: 0.0 standard drinks    Comment: socially, rare   Drug use: No   Family History  Problem Relation Age of Onset   Colon cancer Maternal Grandmother 54   Hypertension Mother    Hyperlipidemia Mother    Cancer Maternal Aunt        cervical/ovarian CA   Cancer Paternal Aunt        breast CA   Esophageal cancer Neg Hx    Stomach cancer Neg Hx    Rectal cancer Neg  Hx    Bladder Cancer Neg Hx    Kidney cancer Neg Hx    Breast cancer Neg Hx    Allergies  Allergen Reactions   Lavender Oil Hives   Other Nausea And Vomiting    PINE NUTS IN PESTO    Current Outpatient Medications on File Prior to Visit  Medication Sig Dispense Refill   cetirizine-pseudoephedrine (ZYRTEC-D) 5-120 MG tablet Take 1 tablet by mouth daily.       No current facility-administered medications on file prior to visit.    Review of Systems  Constitutional:  Negative for activity change, appetite change, fatigue, fever and unexpected weight change.  HENT:  Negative for congestion, ear pain, rhinorrhea, sinus pressure and sore throat.   Eyes:  Negative for pain, redness and visual disturbance.  Respiratory:   Negative for cough, shortness of breath and wheezing.   Cardiovascular:  Negative for chest pain and palpitations.  Gastrointestinal:  Negative for abdominal pain, blood in stool, constipation and diarrhea.  Endocrine: Negative for polydipsia and polyuria.       Hot flashes   Genitourinary:  Negative for dysuria, frequency and urgency.  Musculoskeletal:  Positive for arthralgias. Negative for back pain and myalgias.  Skin:  Negative for pallor and rash.  Allergic/Immunologic: Negative for environmental allergies.  Neurological:  Negative for dizziness, syncope and headaches.  Hematological:  Negative for adenopathy. Does not bruise/bleed easily.  Psychiatric/Behavioral:  Negative for decreased concentration and dysphoric mood. The patient is not nervous/anxious.       Objective:   Physical Exam Constitutional:      General: She is not in acute distress.    Appearance: Normal appearance. She is well-developed. She is obese. She is not ill-appearing or diaphoretic.  HENT:     Head: Normocephalic and atraumatic.     Right Ear: Tympanic membrane, ear canal and external ear normal.     Left Ear: Tympanic membrane, ear canal and external ear normal.     Nose: Nose normal. No congestion.     Mouth/Throat:     Mouth: Mucous membranes are moist.     Pharynx: Oropharynx is clear. No posterior oropharyngeal erythema.  Eyes:     General: No scleral icterus.    Extraocular Movements: Extraocular movements intact.     Conjunctiva/sclera: Conjunctivae normal.     Pupils: Pupils are equal, round, and reactive to light.  Neck:     Thyroid: No thyromegaly.     Vascular: No carotid bruit or JVD.  Cardiovascular:     Rate and Rhythm: Normal rate and regular rhythm.     Pulses: Normal pulses.     Heart sounds: Normal heart sounds.    No gallop.  Pulmonary:     Effort: Pulmonary effort is normal. No respiratory distress.     Breath sounds: Normal breath sounds. No wheezing.     Comments: Good  air exch Chest:     Chest wall: No tenderness.  Abdominal:     General: Bowel sounds are normal. There is no distension or abdominal bruit.     Palpations: Abdomen is soft. There is no mass.     Tenderness: There is no abdominal tenderness.     Hernia: No hernia is present.  Genitourinary:    Comments: Breast exam: No mass, nodules, thickening, tenderness, bulging, retraction, inflamation, nipple discharge or skin changes noted.  No axillary or clavicular LA.     Musculoskeletal:        General: No tenderness. Normal  range of motion.     Cervical back: Normal range of motion and neck supple. No rigidity. No muscular tenderness.     Right lower leg: No edema.     Left lower leg: No edema.  Lymphadenopathy:     Cervical: No cervical adenopathy.  Skin:    General: Skin is warm and dry.     Coloration: Skin is not pale.     Findings: No erythema or rash.     Comments: Skin tags on ant/lat neck  Neurological:     Mental Status: She is alert. Mental status is at baseline.     Cranial Nerves: No cranial nerve deficit.     Motor: No abnormal muscle tone.     Coordination: Coordination normal.     Gait: Gait normal.     Deep Tendon Reflexes: Reflexes are normal and symmetric. Reflexes normal.  Psychiatric:        Mood and Affect: Mood normal.        Cognition and Memory: Cognition and memory normal.          Assessment & Plan:   Problem List Items Addressed This Visit       Other   Hyperlipidemia, unspecified    Disc goals for lipids and reasons to control them Rev last labs with pt Rev low sat fat diet in detail Labs ordered today  Pt tolerates zocor 20 mg daily well  Also avoiding fried foods and red meat        Relevant Medications   simvastatin (ZOCOR) 20 MG tablet   Other Relevant Orders   Lipid panel (Completed)   Obesity    Discussed how this problem influences overall health and the risks it imposes  Reviewed plan for weight loss with lower calorie diet (via  better food choices and also portion control or program like weight watchers) and exercise building up to or more than 30 minutes 5 days per week including some aerobic activity   Plan made to add exercise to schedule      Routine general medical examination at a health care facility - Primary    Reviewed health habits including diet and exercise and skin cancer prevention Reviewed appropriate screening tests for age  Also reviewed health mt list, fam hx and immunization status , as well as social and family history   See HPI Labs ordered Colonoscopy is utd Mammogram utd Pap utd  Interested in shingrix, enc to check on coverage  Doing well with menopausal change       Relevant Orders   Comprehensive metabolic panel (Completed)   CBC with Differential/Platelet (Completed)   TSH (Completed)   Lipid panel (Completed)

## 2021-08-03 ENCOUNTER — Other Ambulatory Visit: Payer: Self-pay | Admitting: Family Medicine

## 2021-08-03 DIAGNOSIS — Z1231 Encounter for screening mammogram for malignant neoplasm of breast: Secondary | ICD-10-CM

## 2021-09-14 ENCOUNTER — Ambulatory Visit: Payer: BC Managed Care – PPO

## 2021-10-04 ENCOUNTER — Ambulatory Visit
Admission: RE | Admit: 2021-10-04 | Discharge: 2021-10-04 | Disposition: A | Discharge status: Home / Self Care | Payer: No Typology Code available for payment source | Source: Ambulatory Visit | Attending: Family Medicine | Admitting: Family Medicine

## 2021-10-04 DIAGNOSIS — Z1231 Encounter for screening mammogram for malignant neoplasm of breast: Secondary | ICD-10-CM

## 2021-12-30 ENCOUNTER — Telehealth: Payer: Self-pay | Admitting: Family Medicine

## 2021-12-30 DIAGNOSIS — E7800 Pure hypercholesterolemia, unspecified: Secondary | ICD-10-CM

## 2021-12-30 DIAGNOSIS — Z0000 Encounter for general adult medical examination without abnormal findings: Secondary | ICD-10-CM

## 2021-12-30 NOTE — Telephone Encounter (Signed)
-----   Message from Velna Hatchet, RT sent at 12/17/2021 10:32 AM EST ----- Regarding: Mon 12/4 lab Patient is scheduled for cpx, please order future labs.  Thanks, Anda Kraft

## 2021-12-31 ENCOUNTER — Other Ambulatory Visit (INDEPENDENT_AMBULATORY_CARE_PROVIDER_SITE_OTHER): Payer: No Typology Code available for payment source

## 2021-12-31 DIAGNOSIS — Z0000 Encounter for general adult medical examination without abnormal findings: Secondary | ICD-10-CM

## 2021-12-31 DIAGNOSIS — E7800 Pure hypercholesterolemia, unspecified: Secondary | ICD-10-CM

## 2021-12-31 LAB — COMPREHENSIVE METABOLIC PANEL
ALT: 36 U/L — ABNORMAL HIGH (ref 0–35)
AST: 24 U/L (ref 0–37)
Albumin: 4.3 g/dL (ref 3.5–5.2)
Alkaline Phosphatase: 81 U/L (ref 39–117)
BUN: 17 mg/dL (ref 6–23)
CO2: 27 mEq/L (ref 19–32)
Calcium: 9.2 mg/dL (ref 8.4–10.5)
Chloride: 105 mEq/L (ref 96–112)
Creatinine, Ser: 0.65 mg/dL (ref 0.40–1.20)
GFR: 97.19 mL/min (ref >60.00)
Glucose, Bld: 89 mg/dL (ref 70–99)
Potassium: 4 mEq/L (ref 3.5–5.1)
Sodium: 139 mEq/L (ref 135–145)
Total Bilirubin: 0.5 mg/dL (ref 0.2–1.2)
Total Protein: 7.1 g/dL (ref 6.0–8.3)

## 2021-12-31 LAB — CBC WITH DIFFERENTIAL/PLATELET
Basophils Absolute: 0 10*3/uL (ref 0.0–0.1)
Basophils Relative: 1 % (ref 0.0–3.0)
Eosinophils Absolute: 0.2 10*3/uL (ref 0.0–0.7)
Eosinophils Relative: 5.3 % — ABNORMAL HIGH (ref 0.0–5.0)
HCT: 39.7 % (ref 36.0–46.0)
Hemoglobin: 13.4 g/dL (ref 12.0–15.0)
Lymphocytes Relative: 36.3 % (ref 12.0–46.0)
Lymphs Abs: 1.5 10*3/uL (ref 0.7–4.0)
MCHC: 33.7 g/dL (ref 30.0–36.0)
MCV: 93.3 fl (ref 78.0–100.0)
Monocytes Absolute: 0.4 10*3/uL (ref 0.1–1.0)
Monocytes Relative: 9.8 % (ref 3.0–12.0)
Neutro Abs: 2 10*3/uL (ref 1.4–7.7)
Neutrophils Relative %: 47.6 % (ref 43.0–77.0)
Platelets: 229 10*3/uL (ref 150.0–400.0)
RBC: 4.25 Mil/uL (ref 3.87–5.11)
RDW: 13.2 % (ref 11.5–15.5)
WBC: 4.2 10*3/uL (ref 4.0–10.5)

## 2021-12-31 LAB — LIPID PANEL
Cholesterol: 248 mg/dL — ABNORMAL HIGH (ref 0–200)
HDL: 62.3 mg/dL (ref >39.00)
NonHDL: 186.07
Total CHOL/HDL Ratio: 4
Triglycerides: 212 mg/dL — ABNORMAL HIGH (ref 0.0–149.0)
VLDL: 42.4 mg/dL — ABNORMAL HIGH (ref 0.0–40.0)

## 2021-12-31 LAB — LDL CHOLESTEROL, DIRECT: Direct LDL: 157 mg/dL

## 2021-12-31 LAB — TSH: TSH: 2.59 u[IU]/mL (ref 0.35–5.50)

## 2022-01-07 ENCOUNTER — Ambulatory Visit (INDEPENDENT_AMBULATORY_CARE_PROVIDER_SITE_OTHER): Payer: No Typology Code available for payment source | Admitting: Family Medicine

## 2022-01-07 ENCOUNTER — Encounter: Payer: Self-pay | Admitting: Family Medicine

## 2022-01-07 VITALS — BP 128/78 | HR 83 | Temp 97.8°F | Ht 62.25 in | Wt 226.1 lb

## 2022-01-07 DIAGNOSIS — Z0000 Encounter for general adult medical examination without abnormal findings: Secondary | ICD-10-CM | POA: Diagnosis not present

## 2022-01-07 DIAGNOSIS — L708 Other acne: Secondary | ICD-10-CM | POA: Diagnosis not present

## 2022-01-07 DIAGNOSIS — E7800 Pure hypercholesterolemia, unspecified: Secondary | ICD-10-CM

## 2022-01-07 DIAGNOSIS — J301 Allergic rhinitis due to pollen: Secondary | ICD-10-CM

## 2022-01-07 MED ORDER — SIMVASTATIN 20 MG PO TABS: ORAL_TABLET | ORAL | 3 refills | Status: DC

## 2022-01-07 NOTE — Assessment & Plan Note (Signed)
Reviewed health habits including diet and exercise and skin cancer prevention Reviewed appropriate screening tests for age  Also reviewed health mt list, fam hx and immunization status , as well as social and family history   See HPI Labs reviewed  Plans to get shingrix if covered  Pap utd 2021 and declines need for STD screening  Mammogram utd 09/2021  Colonoscopy utd 9/20216

## 2022-01-07 NOTE — Patient Instructions (Addendum)
Think about regular exercise with strength training (weights/bands/ machines or whatever you like)   Try to get 1200-1500 mg of calcium per day with at least 1000 iu of vitamin D - for bone health  For weight loss  Try to get most of your carbohydrates from produce (with the exception of white potatoes)  Eat less bread/pasta/rice/snack foods/cereals/sweets and other items from the middle of the grocery store (processed carbs)  The mediterranean diet is a good choice   Get back on simvastatin   Go and get your shingles shot   Try flonase or nasacort daily for sinus issues

## 2022-01-07 NOTE — Assessment & Plan Note (Addendum)
Struggling more in setting of menopause and cravings Discussed how this problem influences overall health and the risks it imposes  Reviewed plan for weight loss with lower calorie diet (via better food choices and also portion control or program like weight watchers) and exercise building up to or more than 30 minutes 5 days per week including some aerobic activity   Plan made to inc strength training to help inc metabolic rate  Rev low glycemic eating parameters  One liver test is slt elevated /poss fatty liver  Will continue to monitor

## 2022-01-07 NOTE — Assessment & Plan Note (Signed)
Recommend flonase or other steroid ns to deal with chronic congestion  F/u if not improved

## 2022-01-07 NOTE — Assessment & Plan Note (Signed)
Disc goals for lipids and reasons to control them Rev last labs with pt Rev low sat fat diet in detail Level of LDL up to 135 since she ran out of simvastatin   This was refilled to return to it and healthy diet

## 2022-01-07 NOTE — Progress Notes (Signed)
Subjective:    Patient ID: Katherine Schwartz, female    DOB: 16-Jul-1965, 56 y.o.   MRN: [id number]  HPI Here for health maintenance exam and to review chronic medical problems    Wt Readings from Last 3 Encounters:  01/07/22 226 lb 2 oz (102.6 kg)  01/03/21 196 lb 8 oz (89.1 kg)  12/13/19 192 lb 9 oz (87.3 kg)   41.03 kg/m  Battling with menopause  Had a lot of hot flashes early this year  Decreased concentration - brain fog  Does not tolerate alcohol- gets flushed Is starting to improve  Gained weight  Stopped eating at night  Increasing water   Holidays are a challenge   Doing line dancing  Was walking-lost motivation  Has done zumba in the past  Wants to get back to weights   Immunization History  Administered Date(s) Administered   Influenza Inj Mdck Quad Pf 10/04/2020   Influenza Whole 01/29/2004, 10/29/2006   Influenza, Quadrivalent, Recombinant, Inj, Pf 09/28/2016   Influenza, Seasonal, Injecte, Preservative Fre 10/21/2014   Influenza,inj,Quad PF,6+ Mos 09/13/2017, 10/07/2018, 09/27/2019   Influenza-Unspecified 10/28/2012, 10/29/2013, 10/17/2015   PFIZER Comirnaty(Gray Top)Covid-19 Tri-Sucrose Vaccine 05/24/2020   PFIZER(Purple Top)SARS-COV-2 Vaccination 04/29/2019, 06/27/2019, 12/11/2019   Td 02/28/2005   Tdap 11/27/2015   Health Maintenance Due  Topic Date Due   Zoster Vaccines- Shingrix (1 of 2) Never done   PAP SMEAR-Modifier  12/12/2020   Shingrix: interested   Pap 11/2019 nl No gyn symptoms or problems  No vaginal bleeding   Declines STD screening   Mammogram 09/2021 Self breast exam : no lumps   Colonoscopy 09/2014 with 10 y recall  BP Readings from Last 3 Encounters:  01/07/22 128/78  01/03/21 134/84  12/13/19 138/78   Pulse Readings from Last 3 Encounters:  01/07/22 83  01/03/21 80  12/13/19 89    Hyperlipidemia Lab Results  Component Value Date   CHOL 248 (H) 12/31/2021   CHOL 224 (H) 01/03/2021   CHOL 206 (H) 12/13/2019    Lab Results  Component Value Date   HDL 62.30 12/31/2021   HDL 68.50 01/03/2021   HDL 64.40 12/13/2019   Lab Results  Component Value Date   LDLCALC 135 (H) 01/03/2021   LDLCALC 109 (H) 12/13/2019   LDLCALC 119 (H) 12/10/2018   Lab Results  Component Value Date   TRIG 212.0 (H) 12/31/2021   TRIG 106.0 01/03/2021   TRIG 165.0 (H) 12/13/2019   Lab Results  Component Value Date   CHOLHDL 4 12/31/2021   CHOLHDL 3 01/03/2021   CHOLHDL 3 12/13/2019   Lab Results  Component Value Date   LDLDIRECT 157.0 12/31/2021   LDLDIRECT 172.0 12/05/2017   LDLDIRECT 176.2 09/22/2008   Simvastatin 20 mg daily - she missed doses of simvastatin when she ran out   Other labs Results for orders placed or performed in visit on 12/31/21  CBC with Differential/Platelet  Result Value Ref Range   WBC 4.2 4.0 - 10.5 K/uL   RBC 4.25 3.87 - 5.11 Mil/uL   Hemoglobin 13.4 12.0 - 15.0 g/dL   HCT 39.7 36.0 - 46.0 %   MCV 93.3 78.0 - 100.0 fl   MCHC 33.7 30.0 - 36.0 g/dL   RDW 13.2 11.5 - 15.5 %   Platelets 229.0 150.0 - 400.0 K/uL   Neutrophils Relative % 47.6 43.0 - 77.0 %   Lymphocytes Relative 36.3 12.0 - 46.0 %   Monocytes Relative 9.8 3.0 - 12.0 %  Eosinophils Relative 5.3 (H) 0.0 - 5.0 %   Basophils Relative 1.0 0.0 - 3.0 %   Neutro Abs 2.0 1.4 - 7.7 K/uL   Lymphs Abs 1.5 0.7 - 4.0 K/uL   Monocytes Absolute 0.4 0.1 - 1.0 K/uL   Eosinophils Absolute 0.2 0.0 - 0.7 K/uL   Basophils Absolute 0.0 0.0 - 0.1 K/uL  Comprehensive metabolic panel  Result Value Ref Range   Sodium 139 135 - 145 mEq/L   Potassium 4.0 3.5 - 5.1 mEq/L   Chloride 105 96 - 112 mEq/L   CO2 27 19 - 32 mEq/L   Glucose, Bld 89 70 - 99 mg/dL   BUN 17 6 - 23 mg/dL   Creatinine, Ser 0.65 0.40 - 1.20 mg/dL   Total Bilirubin 0.5 0.2 - 1.2 mg/dL   Alkaline Phosphatase 81 39 - 117 U/L   AST 24 0 - 37 U/L   ALT 36 (H) 0 - 35 U/L   Total Protein 7.1 6.0 - 8.3 g/dL   Albumin 4.3 3.5 - 5.2 g/dL   GFR 97.19 >60.00 mL/min    Calcium 9.2 8.4 - 10.5 mg/dL  Lipid panel  Result Value Ref Range   Cholesterol 248 (H) 0 - 200 mg/dL   Triglycerides 212.0 (H) 0.0 - 149.0 mg/dL   HDL 62.30 >39.00 mg/dL   VLDL 42.4 (H) 0.0 - 40.0 mg/dL   Total CHOL/HDL Ratio 4    NonHDL 186.07   TSH  Result Value Ref Range   TSH 2.59 0.35 - 5.50 uIU/mL  LDL cholesterol, direct  Result Value Ref Range   Direct LDL 157.0 mg/dL     Fatty liver is possible   Patient Active Problem List   Diagnosis Date Noted   Uterine fibroid 12/03/2016   Screen for STD (sexually transmitted disease) 12/03/2016   Special screening for malignant neoplasms, colon 06/07/2014   Encounter for repeat Pap smear due to previous insufficient cervical cells 08/14/2012   Encounter for routine gynecological examination 06/28/2011   Routine general medical examination at a health care facility 06/26/2010   Morbid obesity ([place]) 08/03/2007   Hyperlipidemia, unspecified 05/01/2007   Allergic rhinitis 05/01/2007   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   Past Medical History:  Diagnosis Date   Acne    Allergy    allergic rhinitis   Hyperlipidemia    Obesity    Past Surgical History:  Procedure Laterality Date   CESAREAN SECTION  1994   EXTRACORPOREAL SHOCK WAVE LITHOTRIPSY Left 02/26/2018   Procedure: EXTRACORPOREAL SHOCK WAVE LITHOTRIPSY (ESWL);  Surgeon: Hollice Espy, MD;  Location: [facility] ORS;  Service: Urology;  Laterality: Left;   Social History   Tobacco Use   Smoking status: Never   Smokeless tobacco: Never  Substance Use Topics   Alcohol use: Yes    Alcohol/week: 0.0 standard drinks of alcohol    Comment: socially, rare   Drug use: No   Family History  Problem Relation Age of Onset   Colon cancer Maternal Grandmother 25   Hypertension Mother    Hyperlipidemia Mother    Cancer Maternal Aunt        cervical/ovarian CA   Cancer Paternal Aunt        breast CA   Esophageal cancer Neg Hx    Stomach cancer Neg Hx    Rectal cancer Neg Hx     Bladder Cancer Neg Hx    Kidney cancer Neg Hx    Breast cancer Neg Hx    Allergies  Allergen  Reactions   Lavender Oil Hives   Other Nausea And Vomiting    PINE NUTS IN PESTO    Current Outpatient Medications on File Prior to Visit  Medication Sig Dispense Refill   cetirizine-pseudoephedrine (ZYRTEC-D) 5-120 MG tablet Take 1 tablet by mouth daily.       No current facility-administered medications on file prior to visit.    Review of Systems  Constitutional:  Positive for fatigue. Negative for activity change, appetite change, fever and unexpected weight change.  HENT:  Negative for congestion, ear pain, rhinorrhea, sinus pressure and sore throat.   Eyes:  Negative for pain, redness and visual disturbance.  Respiratory:  Negative for cough, shortness of breath and wheezing.   Cardiovascular:  Negative for chest pain and palpitations.  Gastrointestinal:  Negative for abdominal pain, blood in stool, constipation and diarrhea.  Endocrine: Positive for heat intolerance. Negative for polydipsia and polyuria.  Genitourinary:  Negative for dysuria, frequency and urgency.  Musculoskeletal:  Negative for arthralgias, back pain and myalgias.  Skin:  Negative for pallor and rash.  Allergic/Immunologic: Negative for environmental allergies.  Neurological:  Negative for dizziness, syncope and headaches.  Hematological:  Negative for adenopathy. Does not bruise/bleed easily.  Psychiatric/Behavioral:  Positive for decreased concentration and sleep disturbance. Negative for dysphoric mood. The patient is not nervous/anxious.        Moodiness  Some brain fog        Objective:   Physical Exam Constitutional:      General: She is not in acute distress.    Appearance: Normal appearance. She is well-developed. She is obese. She is not ill-appearing or diaphoretic.  HENT:     Head: Normocephalic and atraumatic.     Right Ear: Tympanic membrane, ear canal and external ear normal.     Left Ear:  Tympanic membrane, ear canal and external ear normal.     Nose: Nose normal. No congestion.     Mouth/Throat:     Mouth: Mucous membranes are moist.     Pharynx: Oropharynx is clear. No posterior oropharyngeal erythema.  Eyes:     General: No scleral icterus.    Extraocular Movements: Extraocular movements intact.     Conjunctiva/sclera: Conjunctivae normal.     Pupils: Pupils are equal, round, and reactive to light.  Neck:     Thyroid: No thyromegaly.     Vascular: No carotid bruit or JVD.  Cardiovascular:     Rate and Rhythm: Normal rate and regular rhythm.     Pulses: Normal pulses.     Heart sounds: Normal heart sounds.     No gallop.  Pulmonary:     Effort: Pulmonary effort is normal. No respiratory distress.     Breath sounds: Normal breath sounds. No wheezing.     Comments: Good air exch Chest:     Chest wall: No tenderness.  Abdominal:     General: Bowel sounds are normal. There is no distension or abdominal bruit.     Palpations: Abdomen is soft. There is no mass.     Tenderness: There is no abdominal tenderness.     Hernia: No hernia is present.  Genitourinary:    Comments: Breast exam: No mass, nodules, thickening, tenderness, bulging, retraction, inflamation, nipple discharge or skin changes noted.  No axillary or clavicular LA.     Musculoskeletal:        General: No tenderness. Normal range of motion.     Cervical back: Normal range of motion and neck supple.  No rigidity. No muscular tenderness.     Right lower leg: No edema.     Left lower leg: No edema.     Comments: No kyphosis   Lymphadenopathy:     Cervical: No cervical adenopathy.  Skin:    General: Skin is warm and dry.     Coloration: Skin is not pale.     Findings: No erythema or rash.     Comments: Skin tags on neck and trunk  Neurological:     Mental Status: She is alert. Mental status is at baseline.     Cranial Nerves: No cranial nerve deficit.     Motor: No abnormal muscle tone.      Coordination: Coordination normal.     Gait: Gait normal.     Deep Tendon Reflexes: Reflexes are normal and symmetric. Reflexes normal.  Psychiatric:        Mood and Affect: Mood normal.        Cognition and Memory: Cognition and memory normal.     Comments: Pleasant   Candidly discusses symptoms and stressors             Assessment & Plan:   Problem List Items Addressed This Visit       Respiratory   Allergic rhinitis    Recommend flonase or other steroid ns to deal with chronic congestion  F/u if not improved         Musculoskeletal and Integument   ACNE VULGARIS     Other   Hyperlipidemia, unspecified    Disc goals for lipids and reasons to control them Rev last labs with pt Rev low sat fat diet in detail Level of LDL up to 135 since she ran out of simvastatin   This was refilled to return to it and healthy diet       Relevant Medications   simvastatin (ZOCOR) 20 MG tablet   Morbid obesity ([place])    Struggling more in setting of menopause and cravings Discussed how this problem influences overall health and the risks it imposes  Reviewed plan for weight loss with lower calorie diet (via better food choices and also portion control or program like weight watchers) and exercise building up to or more than 30 minutes 5 days per week including some aerobic activity   Plan made to inc strength training to help inc metabolic rate  Rev low glycemic eating parameters  One liver test is slt elevated /poss fatty liver  Will continue to monitor       Routine general medical examination at a health care facility - Primary    Reviewed health habits including diet and exercise and skin cancer prevention Reviewed appropriate screening tests for age  Also reviewed health mt list, fam hx and immunization status , as well as social and family history   See HPI Labs reviewed  Plans to get shingrix if covered  Pap utd 2021 and declines need for STD screening  Mammogram  utd 09/2021  Colonoscopy utd 9/20216

## 2022-08-27 ENCOUNTER — Other Ambulatory Visit: Payer: Self-pay | Admitting: Family Medicine

## 2022-08-27 DIAGNOSIS — Z1231 Encounter for screening mammogram for malignant neoplasm of breast: Secondary | ICD-10-CM

## 2022-10-07 ENCOUNTER — Ambulatory Visit: Payer: Managed Care, Other (non HMO)

## 2022-10-24 ENCOUNTER — Ambulatory Visit: Payer: Managed Care, Other (non HMO)

## 2022-11-01 ENCOUNTER — Ambulatory Visit
Admission: RE | Admit: 2022-11-01 | Discharge: 2022-11-01 | Disposition: A | Discharge status: Home / Self Care | Payer: Managed Care, Other (non HMO) | Source: Ambulatory Visit | Attending: Family Medicine | Admitting: Family Medicine

## 2022-11-01 DIAGNOSIS — Z1231 Encounter for screening mammogram for malignant neoplasm of breast: Secondary | ICD-10-CM

## 2023-01-09 ENCOUNTER — Ambulatory Visit (INDEPENDENT_AMBULATORY_CARE_PROVIDER_SITE_OTHER): Payer: Managed Care, Other (non HMO) | Admitting: Family Medicine

## 2023-01-09 ENCOUNTER — Encounter: Payer: Self-pay | Admitting: Family Medicine

## 2023-01-09 ENCOUNTER — Other Ambulatory Visit (HOSPITAL_COMMUNITY)
Admission: RE | Admit: 2023-01-09 | Discharge: 2023-01-09 | Disposition: A | Discharge status: Home / Self Care | Payer: Managed Care, Other (non HMO) | Source: Ambulatory Visit | Attending: Family Medicine | Admitting: Family Medicine

## 2023-01-09 ENCOUNTER — Telehealth: Payer: Self-pay | Admitting: *Deleted

## 2023-01-09 VITALS — BP 134/84 | HR 78 | Temp 98.3°F | Ht 62.25 in | Wt 216.1 lb

## 2023-01-09 DIAGNOSIS — Z113 Encounter for screening for infections with a predominantly sexual mode of transmission: Secondary | ICD-10-CM

## 2023-01-09 DIAGNOSIS — Z01419 Encounter for gynecological examination (general) (routine) without abnormal findings: Secondary | ICD-10-CM | POA: Diagnosis present

## 2023-01-09 DIAGNOSIS — E7800 Pure hypercholesterolemia, unspecified: Secondary | ICD-10-CM | POA: Diagnosis not present

## 2023-01-09 DIAGNOSIS — Z0000 Encounter for general adult medical examination without abnormal findings: Secondary | ICD-10-CM | POA: Diagnosis not present

## 2023-01-09 DIAGNOSIS — Z6839 Body mass index (BMI) 39.0-39.9, adult: Secondary | ICD-10-CM

## 2023-01-09 LAB — CBC WITH DIFFERENTIAL/PLATELET
Basophils Absolute: 0 10*3/uL (ref 0.0–0.1)
Basophils Relative: 0.9 % (ref 0.0–3.0)
Eosinophils Absolute: 0.2 10*3/uL (ref 0.0–0.7)
Eosinophils Relative: 5.3 % — ABNORMAL HIGH (ref 0.0–5.0)
HCT: 40.6 % (ref 36.0–46.0)
Hemoglobin: 13.5 g/dL (ref 12.0–15.0)
Lymphocytes Relative: 35.8 % (ref 12.0–46.0)
Lymphs Abs: 1.4 10*3/uL (ref 0.7–4.0)
MCHC: 33.3 g/dL (ref 30.0–36.0)
MCV: 95.8 fL (ref 78.0–100.0)
Monocytes Absolute: 0.3 10*3/uL (ref 0.1–1.0)
Monocytes Relative: 8.5 % (ref 3.0–12.0)
Neutro Abs: 1.9 10*3/uL (ref 1.4–7.7)
Neutrophils Relative %: 49.5 % (ref 43.0–77.0)
Platelets: 231 10*3/uL (ref 150.0–400.0)
RBC: 4.24 Mil/uL (ref 3.87–5.11)
RDW: 13.3 % (ref 11.5–15.5)
WBC: 3.9 10*3/uL — ABNORMAL LOW (ref 4.0–10.5)

## 2023-01-09 LAB — COMPREHENSIVE METABOLIC PANEL
ALT: 34 U/L (ref 0–35)
AST: 25 U/L (ref 0–37)
Albumin: 4.3 g/dL (ref 3.5–5.2)
Alkaline Phosphatase: 66 U/L (ref 39–117)
BUN: 20 mg/dL (ref 6–23)
CO2: 25 meq/L (ref 19–32)
Calcium: 9.5 mg/dL (ref 8.4–10.5)
Chloride: 107 meq/L (ref 96–112)
Creatinine, Ser: 0.59 mg/dL (ref 0.40–1.20)
GFR: 98.78 mL/min (ref >60.00)
Glucose, Bld: 94 mg/dL (ref 70–99)
Potassium: 4 meq/L (ref 3.5–5.1)
Sodium: 140 meq/L (ref 135–145)
Total Bilirubin: 0.6 mg/dL (ref 0.2–1.2)
Total Protein: 7.1 g/dL (ref 6.0–8.3)

## 2023-01-09 LAB — LIPID PANEL
Cholesterol: 250 mg/dL — ABNORMAL HIGH (ref 0–200)
HDL: 58.6 mg/dL (ref >39.00)
LDL Cholesterol: 153 mg/dL — ABNORMAL HIGH (ref 0–99)
NonHDL: 191.56
Total CHOL/HDL Ratio: 4
Triglycerides: 191 mg/dL — ABNORMAL HIGH (ref 0.0–149.0)
VLDL: 38.2 mg/dL (ref 0.0–40.0)

## 2023-01-09 LAB — TSH: TSH: 2.36 u[IU]/mL (ref 0.35–5.50)

## 2023-01-09 LAB — LDL CHOLESTEROL, DIRECT: Direct LDL: 157 mg/dL

## 2023-01-09 MED ORDER — ROSUVASTATIN CALCIUM 10 MG PO TABS: 10.0000 mg | ORAL_TABLET | Freq: Every day | ORAL | 0 refills | Status: DC

## 2023-01-09 NOTE — Assessment & Plan Note (Signed)
STD screen today done with pap and blood tests  No symptoms  Encouraged safe sexual habits

## 2023-01-09 NOTE — Assessment & Plan Note (Addendum)
Reviewed health habits including diet and exercise and skin cancer prevention Reviewed appropriate screening tests for age  Also reviewed health mt list, fam hx and immunization status , as well as social and family history   See HPI Labs reviewed and ordered Mammogram utd 10/2022 Std screening ordered Discussed fall prevention, supplements and exercise for bone density  PHQ 2 due to some fatigue /sleep issues   Health Maintenance  Topic Date Due   Pap Smear  12/12/2020   COVID-19 Vaccine (6 - 2024-25 season) 01/25/2023*   Mammogram  11/01/2023   Colon Cancer Screening  09/29/2024   DTaP/Tdap/Td vaccine (3 - Td or Tdap) 11/26/2025   Flu Shot  Completed   Hepatitis C Screening  Completed   HIV Screening  Completed   Zoster (Shingles) Vaccine  Completed   HPV Vaccine  Aged Out  *Topic was postponed. The date shown is not the original due date.

## 2023-01-09 NOTE — Progress Notes (Signed)
Subjective:    Patient ID: Katherine Schwartz, female    DOB: [birth year], 57 y.o.   MRN: [id number]  HPI  Here for health maintenance exam and to review chronic medical problems   Wt Readings from Last 3 Encounters:  01/09/23 216 lb 2 oz (98 kg)  01/07/22 226 lb 2 oz (102.6 kg)  01/03/21 196 lb 8 oz (89.1 kg)   39.21 kg/m  Vitals:   01/09/23 0808  BP: 134/84  Pulse: 78  Temp: 98.3 F (36.8 C)  SpO2: 97%    Immunization History  Administered Date(s) Administered   Influenza Inj Mdck Quad Pf 10/04/2020   Influenza Whole 01/29/2004, 10/29/2006   Influenza, Quadrivalent, Recombinant, Inj, Pf 09/28/2016   Influenza, Seasonal, Injecte, Preservative Fre 10/21/2014   Influenza,inj,Quad PF,6+ Mos 09/13/2017, 10/07/2018, 09/27/2019, 11/09/2021   Influenza-Unspecified 10/28/2012, 10/29/2013, 10/17/2015, 09/03/2022   PFIZER Comirnaty(Gray Top)Covid-19 Tri-Sucrose Vaccine 05/24/2020   PFIZER(Purple Top)SARS-COV-2 Vaccination 04/29/2019, 06/27/2019, 12/11/2019   Pfizer(Comirnaty)Fall Seasonal Vaccine 12 years and older 11/09/2021   Td 02/28/2005   Tdap 11/27/2015   Zoster Recombinant(Shingrix) 09/03/2022, 11/27/2022    Health Maintenance Due  Topic Date Due   Cervical Cancer Screening (Pap smear)  12/12/2020   Flu shot -had at pharmacy   Shingrix -had at pharmacy  Mammogram 10/2022  Self breast exam- no lmps   Gyn health Pap 11/2019 neg  Had HPV on pap in 2020  History of fibroid  No bleeding  No discharge   Managing menopause- hot flashes are slowing down    STD screening - is open to   Colon cancer screening - colonoscopy  09/2014 with 10 y recall  Bone health   Falls-none Fractures-none  Working with PT on a sprain at the gym   Last vitamin D No results found for: "25OHVITD2", "25OHVITD3", "VD25OH"  Exercise  PT for sprain  Goes to gym , 02 fitness  Yoga  Weights  Zumba   Weight is down 10 more lb  Cut out late eating and fried food     Mood    01/09/2023    8:42 AM 01/03/2021   11:44 AM 12/13/2019    9:00 AM 12/10/2018    9:32 AM 12/05/2017   12:28 PM  Depression screen PHQ 2/9  Decreased Interest 0 0 0 0 0  Down, Depressed, Hopeless 0 0 0 0 0  PHQ - 2 Score 0 0 0 0 0  Altered sleeping 0 0 0    Tired, decreased energy 1 0 1    Change in appetite 0 0 0    Feeling bad or failure about yourself  0 0 0    Trouble concentrating 1 0 1    Moving slowly or fidgety/restless 0 0 0    Suicidal thoughts 0 0 0    PHQ-9 Score 2 0 2    Difficult doing work/chores  Not difficult at all Not difficult at all     Hyperlipidemia Lab Results  Component Value Date   CHOL 248 (H) 12/31/2021   HDL 62.30 12/31/2021   LDLCALC 135 (H) 01/03/2021   LDLDIRECT 157.0 12/31/2021   TRIG 212.0 (H) 12/31/2021   CHOLHDL 4 12/31/2021   Due for labs  Simvastatin 20 mg daily  Has been eating better       Patient Active Problem List   Diagnosis Date Noted   Uterine fibroid 12/03/2016   Screen for STD (sexually transmitted disease) 12/03/2016   Special screening for malignant neoplasms, colon 06/07/2014  Encounter for routine gynecological examination 06/28/2011   Routine general medical examination at a health care facility 06/26/2010   Morbid obesity ([facility]) 08/03/2007   Hyperlipidemia, unspecified 05/01/2007   Allergic rhinitis 05/01/2007   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   Past Medical History:  Diagnosis Date   Acne    Allergy    allergic rhinitis   Hyperlipidemia    Obesity    Past Surgical History:  Procedure Laterality Date   CESAREAN SECTION  1994   EXTRACORPOREAL SHOCK WAVE LITHOTRIPSY Left 02/26/2018   Procedure: EXTRACORPOREAL SHOCK WAVE LITHOTRIPSY (ESWL);  Surgeon: Vanna Scotland, MD;  Location: [facility] OR[facility];  Service: Urology;  Laterality: Left;   Social History   Tobacco Use   Smoking status: Never   Smokeless tobacco: Never  Substance Use Topics   Alcohol use: Yes    Comment: Couple of times a month   Drug  use: No   Family History  Problem Relation Age of Onset   Colon cancer Maternal Grandmother 62   Hypertension Mother    Hyperlipidemia Mother    Anxiety disorder Mother    Cancer Maternal Aunt        cervical/ovarian CA   Early death Maternal Aunt    Cancer Paternal Aunt        breast CA   Diabetes Father    Obesity Father    Esophageal cancer Neg Hx    Stomach cancer Neg Hx    Rectal cancer Neg Hx    Bladder Cancer Neg Hx    Kidney cancer Neg Hx    Breast cancer Neg Hx    Allergies  Allergen Reactions   Lavender Oil Hives   Other Nausea And Vomiting    PINE NUTS IN PESTO    Current Outpatient Medications on File Prior to Visit  Medication Sig Dispense Refill   cetirizine-pseudoephedrine (ZYRTEC-D) 5-120 MG tablet Take 1 tablet by mouth daily.       fluticasone (CUTIVATE) 0.05 % cream Apply 1 Application topically daily.     pimecrolimus (ELIDEL) 1 % cream Apply 1 Application topically 2 (two) times daily.     simvastatin (ZOCOR) 20 MG tablet TAKE 1 TABLET BY MOUTH EVERYDAY AT BEDTIME 90 tablet 3   No current facility-administered medications on file prior to visit.    Review of Systems  Constitutional:  Negative for activity change, appetite change, fatigue, fever and unexpected weight change.  HENT:  Negative for congestion, ear pain, rhinorrhea, sinus pressure and sore throat.   Eyes:  Negative for pain, redness and visual disturbance.  Respiratory:  Negative for cough, shortness of breath and wheezing.   Cardiovascular:  Negative for chest pain and palpitations.  Gastrointestinal:  Negative for abdominal pain, blood in stool, constipation and diarrhea.  Endocrine: Negative for polydipsia and polyuria.  Genitourinary:  Negative for dysuria, frequency and urgency.  Musculoskeletal:  Negative for arthralgias, back pain and myalgias.  Skin:  Negative for pallor and rash.  Allergic/Immunologic: Negative for environmental allergies.  Neurological:  Negative for  dizziness, syncope and headaches.  Hematological:  Negative for adenopathy. Does not bruise/bleed easily.  Psychiatric/Behavioral:  Negative for decreased concentration and dysphoric mood. The patient is not nervous/anxious.        Objective:   Physical Exam Constitutional:      General: She is not in acute distress.    Appearance: Normal appearance. She is well-developed. She is obese. She is not ill-appearing or diaphoretic.  HENT:     Head: Normocephalic and atraumatic.  Right Ear: Tympanic membrane, ear canal and external ear normal.     Left Ear: Tympanic membrane, ear canal and external ear normal.     Nose: Nose normal. No congestion.     Mouth/Throat:     Mouth: Mucous membranes are moist.     Pharynx: Oropharynx is clear. No posterior oropharyngeal erythema.  Eyes:     General: No scleral icterus.    Extraocular Movements: Extraocular movements intact.     Conjunctiva/sclera: Conjunctivae normal.     Pupils: Pupils are equal, round, and reactive to light.  Neck:     Thyroid: No thyromegaly.     Vascular: No carotid bruit or JVD.  Cardiovascular:     Rate and Rhythm: Normal rate and regular rhythm.     Pulses: Normal pulses.     Heart sounds: Normal heart sounds.     No gallop.  Pulmonary:     Effort: Pulmonary effort is normal. No respiratory distress.     Breath sounds: Normal breath sounds. No wheezing.     Comments: Good air exch Chest:     Chest wall: No tenderness.  Abdominal:     General: Bowel sounds are normal. There is no distension or abdominal bruit.     Palpations: Abdomen is soft. There is no mass.     Tenderness: There is no abdominal tenderness.     Hernia: No hernia is present.  Genitourinary:    Comments: Breast exam: No mass, nodules, thickening, tenderness, bulging, retraction, inflamation, nipple discharge or skin changes noted.  No axillary or clavicular LA.                   Anus appears normal w/o hemorrhoids or masses       External  genitalia : nl appearance and hair distribution/no lesions       Urethral meatus : nl size, no lesions or prolapse       Urethra: no masses, tenderness or scarring      Bladder : no masses or tenderness       Vagina: nl general appearance, no discharge or  Lesions, no significant cystocele  or rectocele       Cervix: no lesions/ discharge or friability      Uterus: nl size, contour, position, and mobility (not fixed) , non tender      Adnexa : no masses, tenderness, enlargement or nodularity    Unable to feel fibroid on exam today        Musculoskeletal:        General: No tenderness. Normal range of motion.     Cervical back: Normal range of motion and neck supple. No rigidity. No muscular tenderness.     Right lower leg: No edema.     Left lower leg: No edema.     Comments: No kyphosis   Lymphadenopathy:     Cervical: No cervical adenopathy.  Skin:    General: Skin is warm and dry.     Coloration: Skin is not pale.     Findings: No erythema or rash.     Comments: Skin tags  Lentigines   Neurological:     Mental Status: She is alert. Mental status is at baseline.     Cranial Nerves: No cranial nerve deficit.     Motor: No abnormal muscle tone.     Coordination: Coordination normal.     Gait: Gait normal.     Deep Tendon Reflexes: Reflexes are normal and symmetric. Reflexes  normal.  Psychiatric:        Mood and Affect: Mood normal.        Cognition and Memory: Cognition and memory normal.           Assessment & Plan:   Problem List Items Addressed This Visit       Other   Screen for STD (sexually transmitted disease)   Relevant Orders   Hepatitis C antibody   HIV Antibody (routine testing w rflx)   Routine general medical examination at a health care facility - Primary   Reviewed health habits including diet and exercise and skin cancer prevention Reviewed appropriate screening tests for age  Also reviewed health mt list, fam hx and immunization status , as  well as social and family history   See HPI Labs reviewed and ordered Mammogram utd 10/2022 Std screening ordered Discussed fall prevention, supplements and exercise for bone density        Relevant Orders   LDL cholesterol, direct   TSH   Lipid Panel   Comprehensive metabolic panel   CBC with Differential/Platelet   Morbid obesity ([facility])   Discussed how this problem influences overall health and the risks it imposes  Reviewed plan for weight loss with lower calorie diet (via better food choices (lower glycemic and portion control) along with exercise building up to or more than 30 minutes 5 days per week including some aerobic activity and strength training         Hyperlipidemia, unspecified   Disc goals for lipids and reasons to control them Rev last labs with pt Rev low sat fat diet in detail Lab today  Taking simvastatin 20 mg daily      Relevant Orders   LDL cholesterol, direct   Lipid Panel   Comprehensive metabolic panel   Encounter for routine gynecological examination   Routine exam and pap  No complaints  Is menopausal       Relevant Orders   Cytology - PAP([place])

## 2023-01-09 NOTE — Assessment & Plan Note (Signed)
 Discussed how this problem influences overall health and the risks it imposes  Reviewed plan for weight loss with lower calorie diet (via better food choices (lower glycemic and portion control) along with exercise building up to or more than 30 minutes 5 days per week including some aerobic activity and strength training

## 2023-01-09 NOTE — Assessment & Plan Note (Signed)
Routine exam and pap  No complaints  Is menopausal

## 2023-01-09 NOTE — Telephone Encounter (Signed)
-----   Message from [facility] sent at 01/09/2023  4:08 PM EST ----- Cholesterol is not optimal I would like to change her statin from simvastatin to generic crestor (rosuvastatin)  It works better at smaller doses  If ok with that please send in crestor 10 mg 1 po every day #90 1 refill to pharmacy  Re check fasting lipids in about 6 weeks

## 2023-01-09 NOTE — Patient Instructions (Addendum)
For bone health  Try to get 1200-1500 mg of calcium per day with at least 2000 iu of vitamin D - for bone health     Keep up the great diet and exercise habits   Labs today  Pap today

## 2023-01-09 NOTE — Assessment & Plan Note (Signed)
Disc goals for lipids and reasons to control them Rev last labs with pt Rev low sat fat diet in detail Lab today  Taking simvastatin 20 mg daily

## 2023-01-10 LAB — HIV ANTIBODY (ROUTINE TESTING W REFLEX): HIV 1&2 Ab, 4th Generation: NONREACTIVE

## 2023-01-10 LAB — HEPATITIS C ANTIBODY: Hepatitis C Ab: NONREACTIVE

## 2023-01-12 NOTE — Addendum Note (Signed)
Addended by: Roxy Manns A on: 01/12/2023 02:17 PM   Modules accepted: Orders

## 2023-01-13 LAB — CYTOLOGY - PAP
Chlamydia: NEGATIVE
Comment: NEGATIVE
Comment: NEGATIVE
Comment: NEGATIVE
Comment: NORMAL
Diagnosis: NEGATIVE
High risk HPV: NEGATIVE
Neisseria Gonorrhea: NEGATIVE
Trichomonas: NEGATIVE

## 2023-02-28 ENCOUNTER — Other Ambulatory Visit (INDEPENDENT_AMBULATORY_CARE_PROVIDER_SITE_OTHER): Payer: Managed Care, Other (non HMO)

## 2023-02-28 DIAGNOSIS — E7800 Pure hypercholesterolemia, unspecified: Secondary | ICD-10-CM

## 2023-02-28 LAB — AST: AST: 22 U/L (ref 0–37)

## 2023-02-28 LAB — ALT: ALT: 28 U/L (ref 0–35)

## 2023-02-28 LAB — LIPID PANEL
Cholesterol: 182 mg/dL (ref 0–200)
HDL: 59.7 mg/dL (ref >39.00)
LDL Cholesterol: 97 mg/dL (ref 0–99)
NonHDL: 122.31
Total CHOL/HDL Ratio: 3
Triglycerides: 125 mg/dL (ref 0.0–149.0)
VLDL: 25 mg/dL (ref 0.0–40.0)

## 2023-03-02 ENCOUNTER — Encounter: Payer: Self-pay | Admitting: Family Medicine

## 2023-03-06 ENCOUNTER — Telehealth: Payer: Self-pay | Admitting: Family Medicine

## 2023-03-06 ENCOUNTER — Other Ambulatory Visit: Payer: Self-pay | Admitting: Family Medicine

## 2023-03-06 NOTE — Telephone Encounter (Signed)
 Copied from CRM [id number]. Topic: Clinical - Prescription Issue >> Mar 06, 2023  3:23 PM Montie POUR wrote: Reason for CRM: Katherine Schwartz needs all her medications to be sent to [facility] going forward so insurance will pay for medications.  Please send rosuvastatin  (CRESTOR ) 10 MG tablet to [facility]. I have corrected preferred pharmacy in chart.

## 2023-03-07 MED ORDER — ROSUVASTATIN CALCIUM 10 MG PO TABS: 10.0000 mg | ORAL_TABLET | Freq: Every day | ORAL | 2 refills | Status: DC

## 2023-03-07 NOTE — Addendum Note (Signed)
 Addended by: Queenie Brunet on: 03/07/2023 03:28 PM   Modules accepted: Orders

## 2023-03-26 ENCOUNTER — Telehealth: Payer: Self-pay

## 2023-03-26 NOTE — Telephone Encounter (Signed)
 Copied from CRM [id number]. Topic: General - Other >> Mar 26, 2023  3:29 PM Rodman Pickle T wrote: Reason for CRM: patient is interested in weight loss medication she would like a call back regarding this.

## 2023-03-27 NOTE — Telephone Encounter (Signed)
 Spoke to pt, scheduled ov for 04/01/23

## 2023-03-27 NOTE — Telephone Encounter (Signed)
 Please schedule a visit in office.

## 2023-04-01 ENCOUNTER — Ambulatory Visit (INDEPENDENT_AMBULATORY_CARE_PROVIDER_SITE_OTHER): Payer: Self-pay | Admitting: Family Medicine

## 2023-04-01 ENCOUNTER — Encounter: Payer: Self-pay | Admitting: Family Medicine

## 2023-04-01 DIAGNOSIS — Z6841 Body Mass Index (BMI) 40.0 and over, adult: Secondary | ICD-10-CM | POA: Diagnosis not present

## 2023-04-01 DIAGNOSIS — Z131 Encounter for screening for diabetes mellitus: Secondary | ICD-10-CM | POA: Insufficient documentation

## 2023-04-01 LAB — POCT GLYCOSYLATED HEMOGLOBIN (HGB A1C): Hemoglobin A1C: 5.5 % (ref 4.0–5.6)

## 2023-04-01 NOTE — Progress Notes (Signed)
 Subjective:    Patient ID: Katherine Schwartz, female    DOB: 1965/09/17, 58 y.o.   MRN: [id number]  HPI  Wt Readings from Last 3 Encounters:  04/01/23 225 lb 6 oz (102.2 kg)  01/09/23 216 lb 2 oz (98 kg)  01/07/22 226 lb 2 oz (102.6 kg)   40.89 kg/m  Vitals:   04/01/23 0856  BP: 128/78  Pulse: 82  Temp: 98.2 F (36.8 C)  SpO2: 97%     Pt presents to discuss obesity    Boyfriend moved in  She gained 9 lb  He likes to cook and eat late at night    Has tried different things to loose weight   NOOM Weight watchers   Low energy  Motivation is less   Has been to the gym Has done yoga  Has done some classes    Working through a coach with her insurance  May consider nutritionist   Father was prediabetic in the past  Obesity in grand parents    Lab Results  Component Value Date   CHOL 182 02/28/2023   HDL 59.70 02/28/2023   LDLCALC 97 02/28/2023   LDLDIRECT 157.0 01/09/2023   TRIG 125.0 02/28/2023   CHOLHDL 3 02/28/2023  Takes simvastatin   Lab Results  Component Value Date   ALT 28 02/28/2023   AST 22 02/28/2023   ALKPHOS 66 01/09/2023   BILITOT 0.6 01/09/2023    Lab Results  Component Value Date   NA 140 01/09/2023   K 4.0 01/09/2023   CO2 25 01/09/2023   GLUCOSE 94 01/09/2023   BUN 20 01/09/2023   CREATININE 0.59 01/09/2023   CALCIUM 9.5 01/09/2023   GFR 98.78 01/09/2023   GFRNONAA 138.67 05/17/2009      Patient Active Problem List   Diagnosis Date Noted   Diabetes mellitus screening 04/01/2023   Uterine fibroid 12/03/2016   Screen for STD (sexually transmitted disease) 12/03/2016   Special screening for malignant neoplasms, colon 06/07/2014   Encounter for routine gynecological examination 06/28/2011   Routine general medical examination at a health care facility 06/26/2010   Morbid obesity (HCC) 08/03/2007   Hyperlipidemia, unspecified 05/01/2007   Allergic rhinitis 05/01/2007   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   Past Medical  History:  Diagnosis Date   Acne    Allergy    allergic rhinitis   Hyperlipidemia    Obesity    Past Surgical History:  Procedure Laterality Date   CESAREAN SECTION  1994   EXTRACORPOREAL SHOCK WAVE LITHOTRIPSY Left 02/26/2018   Procedure: EXTRACORPOREAL SHOCK WAVE LITHOTRIPSY (ESWL);  Surgeon: Vanna Scotland, MD;  Location: [facility];  Service: Urology;  Laterality: Left;   Social History   Tobacco Use   Smoking status: Never   Smokeless tobacco: Never  Substance Use Topics   Alcohol use: Yes    Comment: Couple of times a month   Drug use: No   Family History  Problem Relation Age of Onset   Colon cancer Maternal Grandmother 69   Hypertension Mother    Hyperlipidemia Mother    Anxiety disorder Mother    Cancer Maternal Aunt        cervical/ovarian CA   Early death Maternal Aunt    Cancer Paternal Aunt        breast CA   Diabetes Father    Obesity Father    Esophageal cancer Neg Hx    Stomach cancer Neg Hx    Rectal cancer Neg Hx  Bladder Cancer Neg Hx    Kidney cancer Neg Hx    Breast cancer Neg Hx    Allergies  Allergen Reactions   Lavender Oil Hives   Other Nausea And Vomiting    PINE NUTS IN PESTO    Current Outpatient Medications on File Prior to Visit  Medication Sig Dispense Refill   cetirizine-pseudoephedrine (ZYRTEC-D) 5-120 MG tablet Take 1 tablet by mouth daily as needed.     fluticasone (CUTIVATE) 0.05 % cream Apply 1 Application topically daily as needed.     pimecrolimus (ELIDEL) 1 % cream Apply 1 Application topically daily as needed.     rosuvastatin (CRESTOR) 10 MG tablet Take 1 tablet (10 mg total) by mouth daily. 90 tablet 2   No current facility-administered medications on file prior to visit.    Review of Systems  Constitutional:  Negative for activity change, appetite change, fatigue, fever and unexpected weight change.  HENT:  Negative for congestion, ear pain, rhinorrhea, sinus pressure and sore throat.   Eyes:  Negative for pain,  redness and visual disturbance.  Respiratory:  Negative for cough, shortness of breath and wheezing.   Cardiovascular:  Negative for chest pain and palpitations.  Gastrointestinal:  Negative for abdominal pain, blood in stool, constipation and diarrhea.  Endocrine: Negative for polydipsia and polyuria.  Genitourinary:  Negative for dysuria, frequency and urgency.  Musculoskeletal:  Negative for arthralgias, back pain and myalgias.  Skin:  Negative for pallor and rash.  Allergic/Immunologic: Negative for environmental allergies.  Neurological:  Negative for dizziness, syncope and headaches.  Hematological:  Negative for adenopathy. Does not bruise/bleed easily.  Psychiatric/Behavioral:  Negative for decreased concentration and dysphoric mood. The patient is not nervous/anxious.        Some lack of motivation but not depressed        Objective:   Physical Exam Constitutional:      General: She is not in acute distress.    Appearance: Normal appearance. She is well-developed. She is obese. She is not ill-appearing or diaphoretic.  HENT:     Head: Normocephalic and atraumatic.  Eyes:     Conjunctiva/sclera: Conjunctivae normal.     Pupils: Pupils are equal, round, and reactive to light.  Neck:     Thyroid: No thyromegaly.     Vascular: No carotid bruit or JVD.  Cardiovascular:     Rate and Rhythm: Normal rate and regular rhythm.     Heart sounds: Normal heart sounds.     No gallop.  Pulmonary:     Effort: Pulmonary effort is normal. No respiratory distress.     Breath sounds: Normal breath sounds. No wheezing or rales.  Abdominal:     General: There is no distension or abdominal bruit.     Palpations: Abdomen is soft.  Musculoskeletal:     Cervical back: Normal range of motion and neck supple.     Right lower leg: No edema.     Left lower leg: No edema.  Lymphadenopathy:     Cervical: No cervical adenopathy.  Skin:    General: Skin is warm and dry.     Coloration: Skin is  not pale.     Findings: No rash.  Neurological:     Mental Status: She is alert.     Coordination: Coordination normal.     Deep Tendon Reflexes: Reflexes are normal and symmetric. Reflexes normal.  Psychiatric:        Mood and Affect: Mood normal.  Comments: Pleasant  Talkative            Assessment & Plan:   Problem List Items Addressed This Visit       Other   Morbid obesity (HCC) - Primary   Bmd of 40.8 and recent weight gain from eating with boyfriend /snack foods  No co morbidities currently , A1c 5.5 , no known osa or fatty liver  Does have hight cholesterol/ treated  Doubt insurance would cover weight loss drugs Discussed GLP-1, contrave, topamax  I do not prescribe stimulants  Aware of potential co morbidities that could develop with time if weight is not lost   Discussed need for healthier diet / low glycemic with more protein  Discussed need for more strength building exercise  Lacks motivation at times but not depressed   Discussed options for programs Will try NOOM again  Discussed compounded GLP-1 in that program if she chooses and pros/cons   [facility] but wait is likely long to get in   She may have access to nutritionist for work / will ask her health coach   Encouraged pt to make small changes as she goes forward and reach out if anything is needed        Diabetes mellitus screening   Lab Results  Component Value Date   HGBA1C 5.5 04/01/2023   Not on dm or pre diabetic range  Discussed plan for fitness and weight loss with low glycemic diet for dm prevention       Relevant Orders   POCT glycosylated hemoglobin (Hb A1C) (Completed)

## 2023-04-01 NOTE — Patient Instructions (Addendum)
 For healthy weight  Try to get most of your carbohydrates from produce (with the exception of white potatoes) and whole grains Eat less bread/pasta/rice/snack foods/cereals/sweets and other items from the middle of the grocery store (processed carbs)  Avoid red meat/ fried foods/ egg yolks/ fatty breakfast meats/ butter, cheese and high fat dairy/ and shellfish    Stay active Add some strength training to your routine, this is important for bone and brain health and can reduce your risk of falls and help your body use insulin properly and regulate weight  Light weights, exercise bands , and internet videos are a good way to start  Yoga (chair or regular), machines , floor exercises or a gym with machines are also good options     Consider going back to NOOM or weight watchers  Reach out about a nutritionist   Hb A1c test today for blood sugar    Keep [place] posted

## 2023-04-01 NOTE — Assessment & Plan Note (Signed)
 Lab Results  Component Value Date   HGBA1C 5.5 04/01/2023   Not on dm or pre diabetic range  Discussed plan for fitness and weight loss with low glycemic diet for dm prevention

## 2023-04-01 NOTE — Assessment & Plan Note (Signed)
 Bmd of 40.8 and recent weight gain from eating with boyfriend /snack foods  No co morbidities currently , A1c 5.5 , no known osa or fatty liver  Does have hight cholesterol/ treated  Doubt insurance would cover weight loss drugs Discussed GLP-1, contrave, topamax  I do not prescribe stimulants  Aware of potential co morbidities that could develop with time if weight is not lost   Discussed need for healthier diet / low glycemic with more protein  Discussed need for more strength building exercise  Lacks motivation at times but not depressed   Discussed options for programs Will try NOOM again  Discussed compounded GLP-1 in that program if she chooses and pros/cons   [facility] but wait is likely long to get in   She may have access to nutritionist for work / will ask her health coach   Encouraged pt to make small changes as she goes forward and reach out if anything is needed

## 2023-04-29 IMAGING — MG MM DIGITAL SCREENING BILAT W/ TOMO AND CAD
6 of 10 series · 6 of 30 positions shown · non-contrast
Comparison: Previous exam(s).

CLINICAL DATA: Screening.

EXAM:
DIGITAL SCREENING BILATERAL MAMMOGRAM WITH TOMOSYNTHESIS AND CAD
TECHNIQUE: Bilateral screening digital craniocaudal and mediolateral oblique
mammograms were obtained. Bilateral screening digital breast
tomosynthesis was performed. The images were evaluated with
computer-aided detection.

[L MLO synth-2D (1 of 2)]
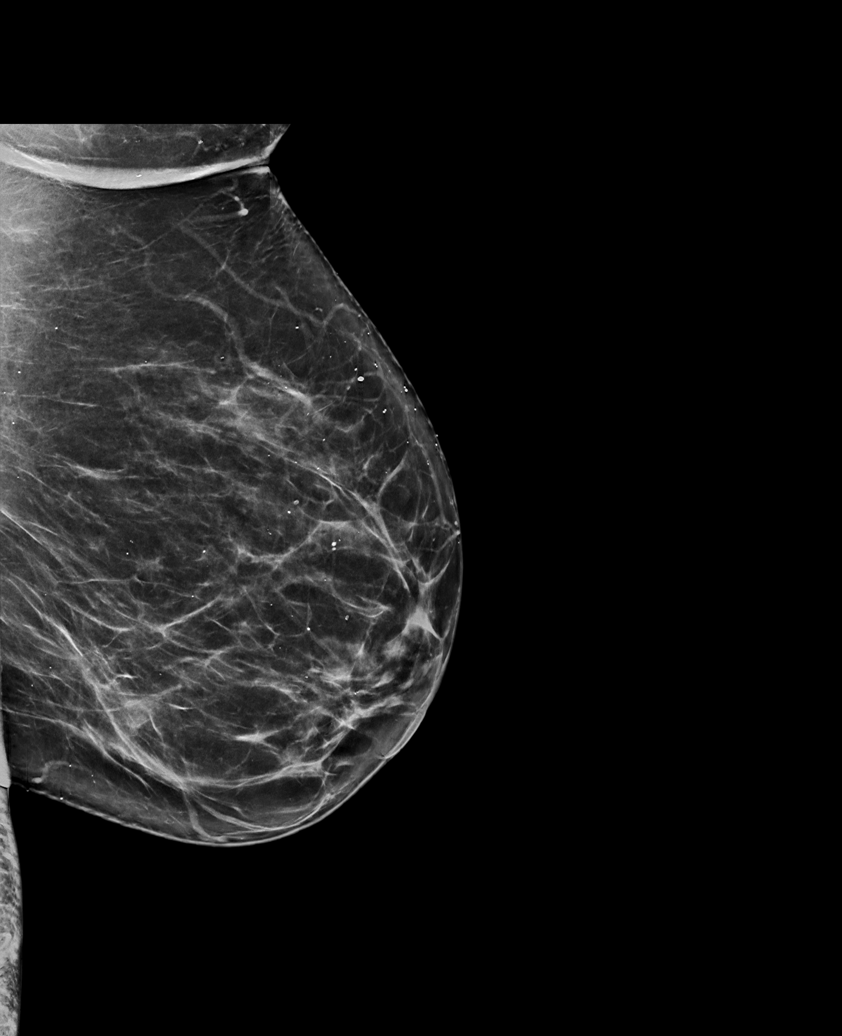

[R CC synth-2D]
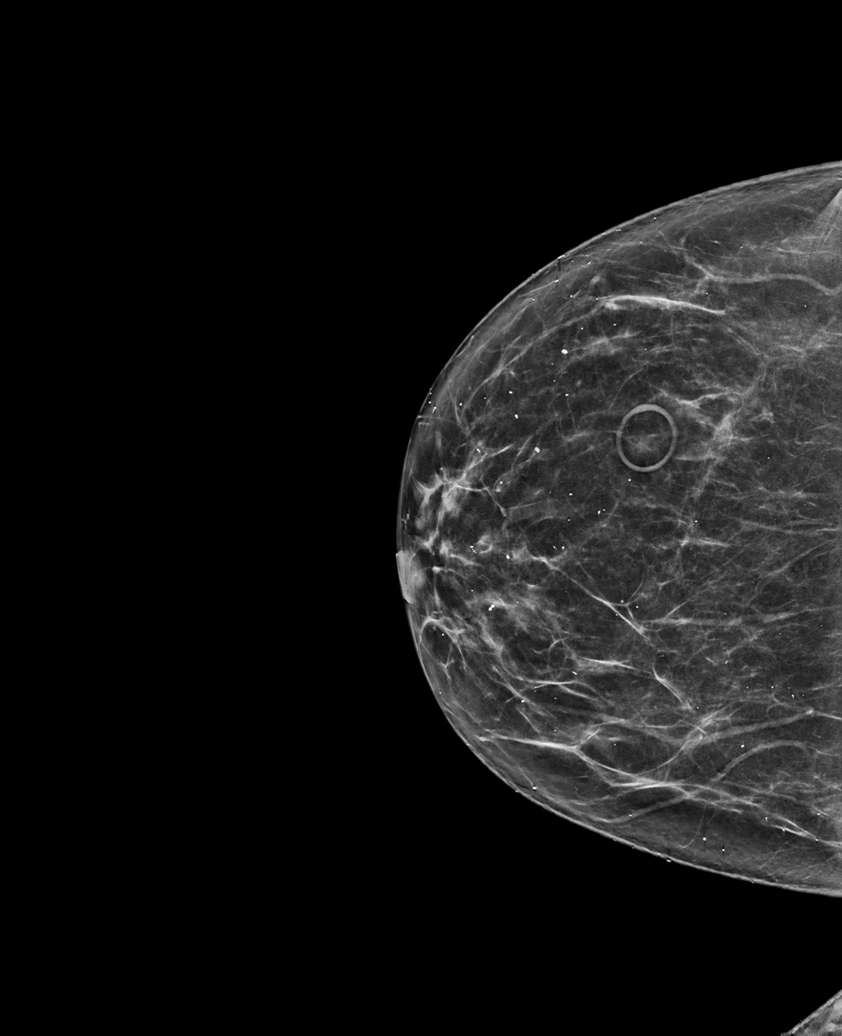

[L MLO synth-2D (2 of 2)]
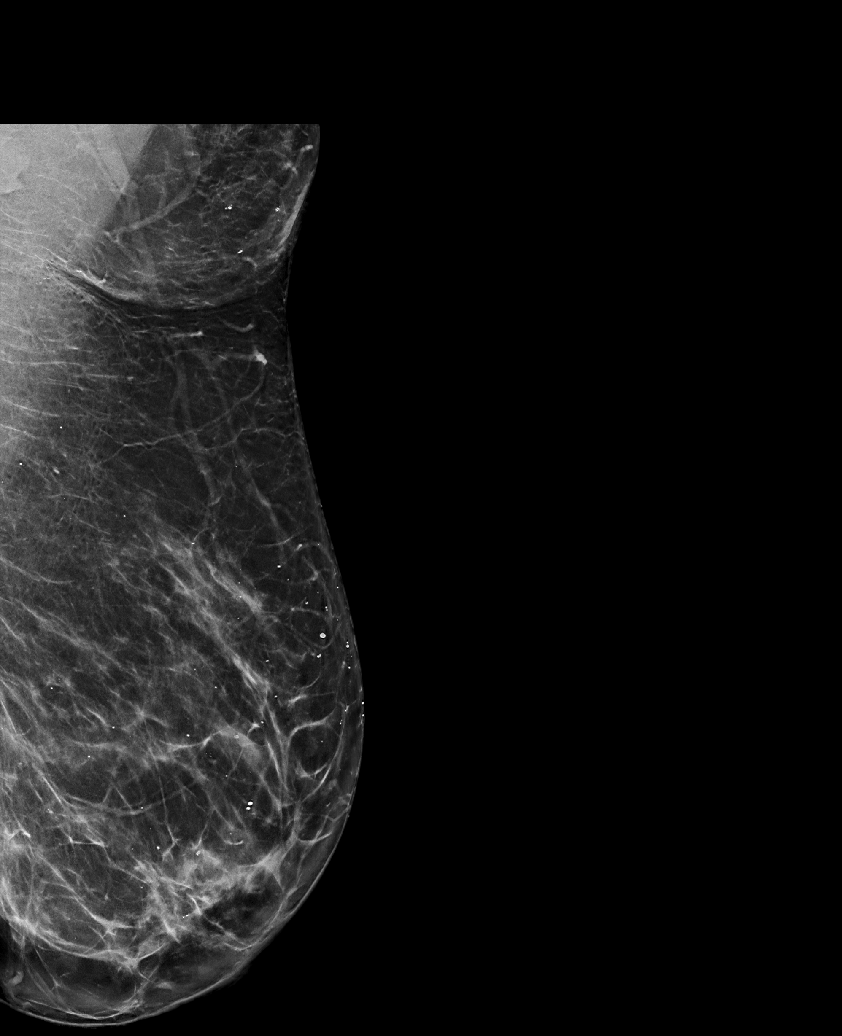

[L CC synth-2D]
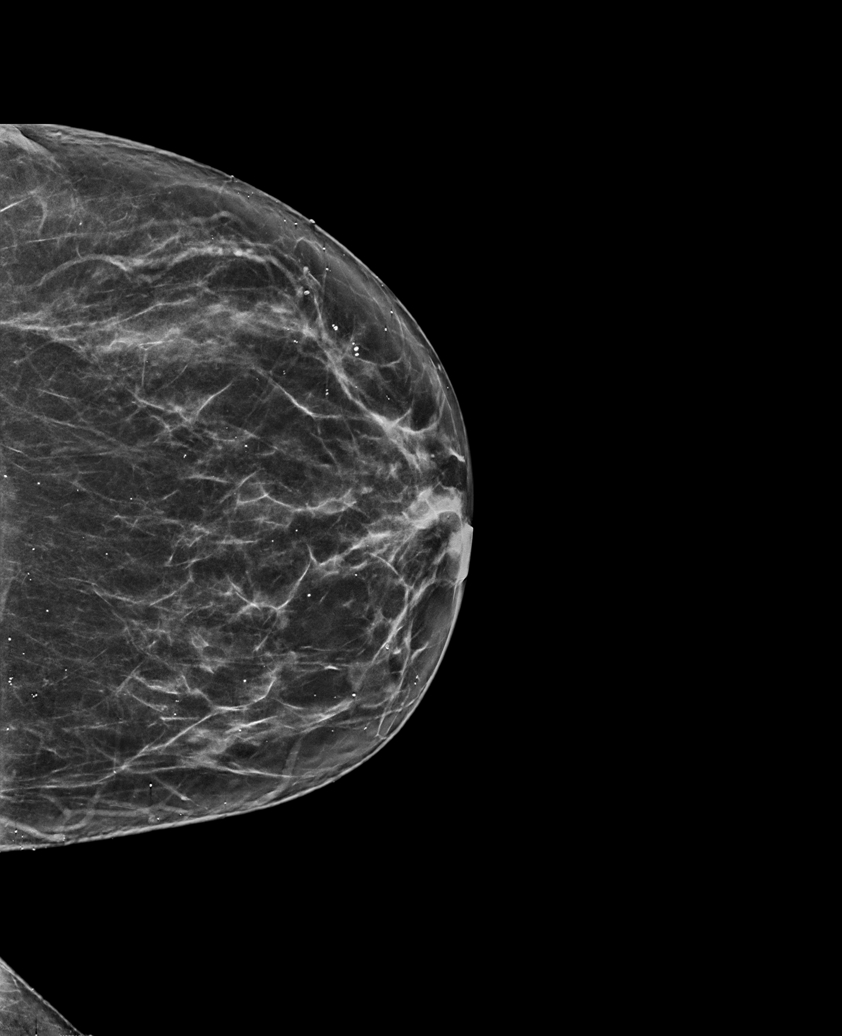

[R MLO synth-2D]
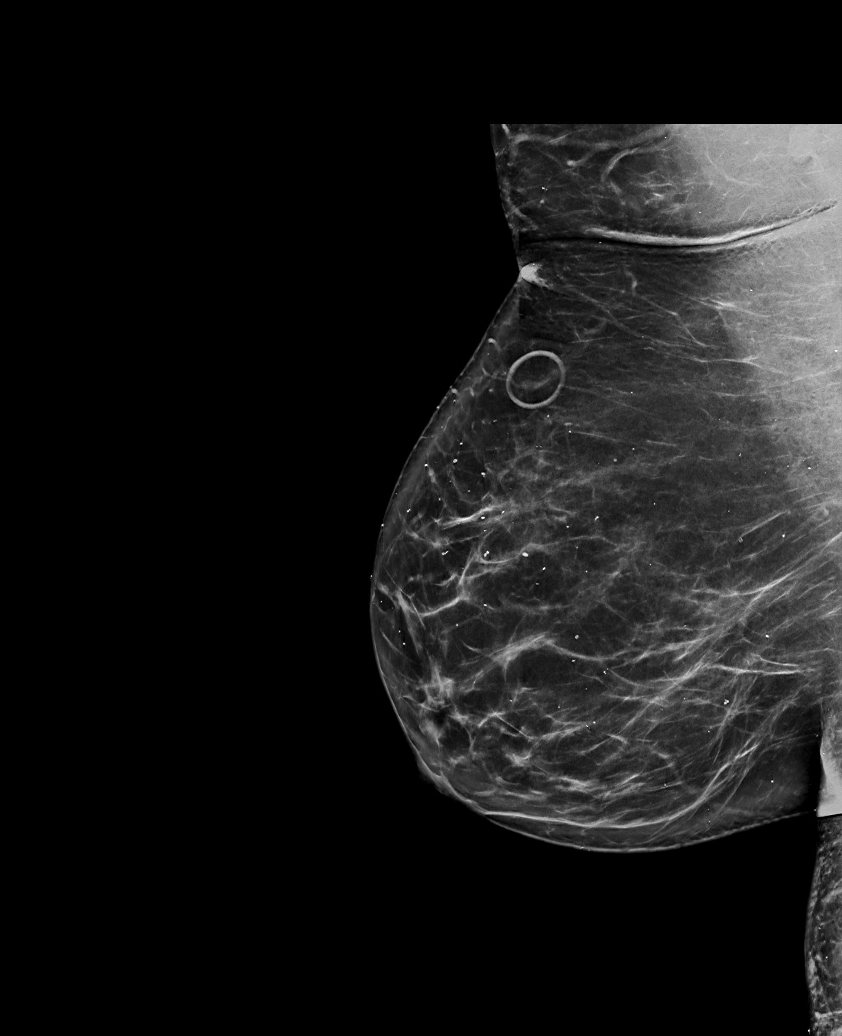

[R CC tomo · tomo slice 35/69.0]
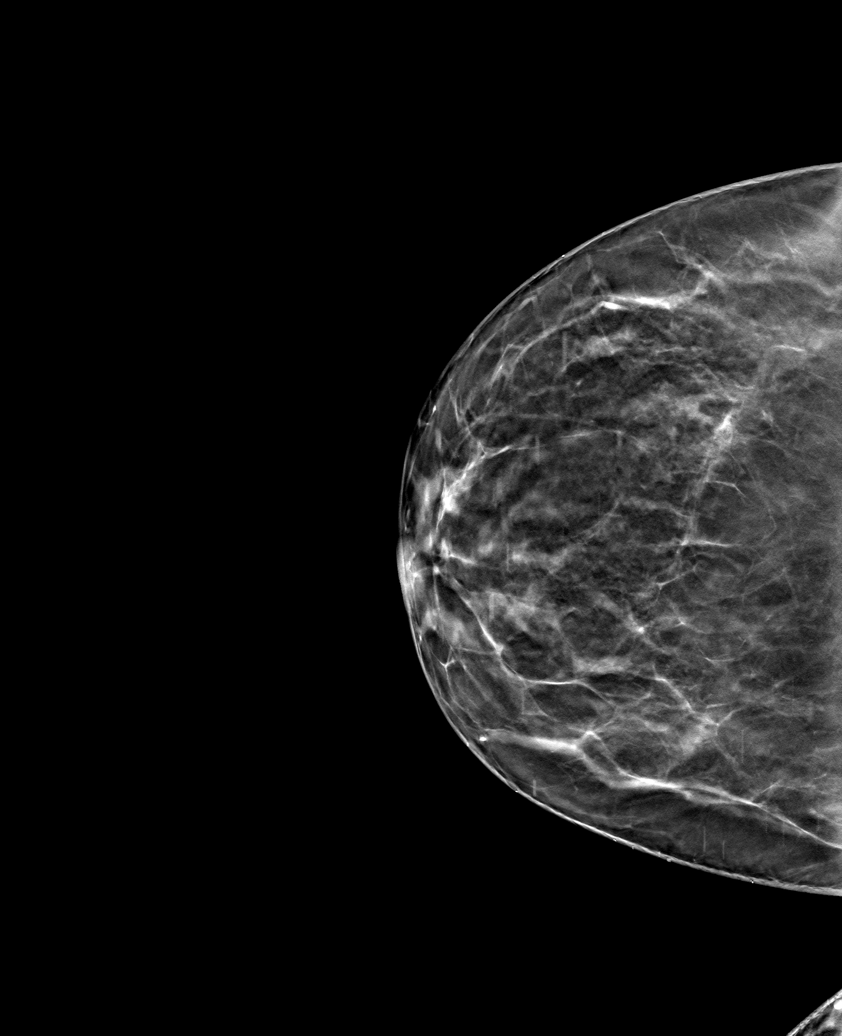

[6 of 30 positions shown; findings below may reference images not displayed]

ACR Breast Density Category b: There are scattered areas of
fibroglandular density.
FINDINGS: There are no findings suspicious for malignancy.
IMPRESSION: No mammographic evidence of malignancy. A result letter of this
screening mammogram will be mailed directly to the patient.

RECOMMENDATION:
Screening mammogram in one year. (Code:51-O-LD2)

BI-RADS CATEGORY  1: Negative.

## 2023-07-22 ENCOUNTER — Telehealth: Payer: Self-pay

## 2023-07-22 NOTE — Telephone Encounter (Signed)
 Ok, before I do that please ask her how she is tolerating that 2 mg dose? Any side effects? Is it helping? How much weight has she lost ?   Is there a way to get their last progress note ?  Thanks so much  Return to me please

## 2023-07-22 NOTE — Telephone Encounter (Signed)
 Copied from CRM [id number]. Topic: Clinical - Medication Question >> Jul 22, 2023 11:45 AM Robinson H wrote: Reason for CRM: Patient is calling to find out if she can have a prescription for the [facility] to order straight through the manufacture paying cash out of pocket instead of going through her insurance. States she's currently taking the Semaglutide 2mg  and health bar that's providing medication will be running out of the compound due to the lawsuit, please reach out.  [place] [phone]

## 2023-07-23 NOTE — Telephone Encounter (Addendum)
 Called pt and she said that she is tolerating the 2 mg dose fine with no side eff. Pt said she isn't sure how much weight she loss because she hasn't been keeping track. Pt said in the beginning they did weight her and she had lost 10 lbs but that was in the beginning and she hasn't weighed in a while.   The company is [employer] in [place]. That does IV infusions and injections. She said she wold see a nurse and get the shot. Called the # listed on their website, I called multiple times and no answer and no voicemail (kept ringing) so I'm not sure if they even have office notes to send to PCP but was unable to reach them and they have a fax # listed on their website either

## 2023-07-24 MED ORDER — WEGOVY 1.7 MG/0.75ML ~~LOC~~ SOAJ: 1.7000 mg | SUBCUTANEOUS | 0 refills | Status: DC

## 2023-07-24 NOTE — Addendum Note (Signed)
 Addended by: RANDEEN HARDY A on: 07/24/2023 09:01 AM   Modules accepted: Orders

## 2023-07-24 NOTE — Telephone Encounter (Signed)
 Katherine Schwartz comes in 1.7 and 2.4 - not 2 mg   Sounds like it was compounded The actual medicine may be stronger so I will start with the 1.7 mg   Printed and in IN box   Please follow up in 3-4 weeks  Since I am prescribing now we need to check weight and exam   Thanks

## 2023-07-24 NOTE — Telephone Encounter (Signed)
 Pt notified of Dr. Graham comments and verbalized understanding. Rx placed at front desk for pick up and f/u appt scheduled

## 2023-07-25 ENCOUNTER — Telehealth: Payer: Self-pay

## 2023-07-25 ENCOUNTER — Other Ambulatory Visit (HOSPITAL_COMMUNITY): Payer: Self-pay

## 2023-07-25 NOTE — Telephone Encounter (Signed)
 Pt is aware not coved she picked up a paper Rx to send to the Manufacture (Wegovy ) because it's a lower price. No PA needed

## 2023-07-25 NOTE — Telephone Encounter (Signed)
 Pharmacy Patient Advocate Encounter   Received notification from CoverMyMeds that prior authorization for Wegovy  1.7 is required/requested.   Insurance verification completed.   The patient is insured through [employer] .   Per test claim: patient has a plan benefit exclusion. Must contact plan for enrollment in [facility]

## 2023-08-19 ENCOUNTER — Other Ambulatory Visit: Payer: Self-pay | Admitting: Family Medicine

## 2023-08-19 NOTE — Telephone Encounter (Signed)
 Last filled on 07/24/23 # 3 mL/ 0 refills   F/u scheduled Thursday 08/21/23

## 2023-08-19 NOTE — Telephone Encounter (Signed)
 Do you want to go up to the 2.4 mg dose ?

## 2023-08-21 ENCOUNTER — Encounter: Payer: Self-pay | Admitting: Family Medicine

## 2023-08-21 ENCOUNTER — Ambulatory Visit (INDEPENDENT_AMBULATORY_CARE_PROVIDER_SITE_OTHER): Admitting: Family Medicine

## 2023-08-21 ENCOUNTER — Other Ambulatory Visit (HOSPITAL_COMMUNITY): Payer: Self-pay

## 2023-08-21 ENCOUNTER — Telehealth: Payer: Self-pay

## 2023-08-21 MED ORDER — WEGOVY 1.7 MG/0.75ML ~~LOC~~ SOAJ: 1.7000 mg | SUBCUTANEOUS | 3 refills | Status: DC

## 2023-08-21 NOTE — Patient Instructions (Addendum)
 Keep exercising  Strength training - especially  Make good food choices     Make sure to get lots of lean protein   The following are examples of protein in diet  Meat  Fish  Eggs  Dairy products  Soy products  Oat milk  Almond milk Nuts and nut butters  Legumes  Dried beans    Continue the wegovy  at 1.7 mg for now  If/when you are ready to go up on dose let us  know   Take care of yourself

## 2023-08-21 NOTE — Assessment & Plan Note (Signed)
 Doing well with wegovy  1.7 mg weekly -wants to stay on this dose for a while before increasing it  Tolerates fairly / mild nausea in between meals  Has vacation coming up  Discussed food choices Needs to increase protein intake for meals and snacks-reviewed options  Small meals Continue strength training exercise   Pt will call if /when she wants to increase dose

## 2023-08-21 NOTE — Telephone Encounter (Signed)
 Pharmacy Patient Advocate Encounter   Received notification from CoverMyMeds that prior authorization for Wegovy  1.7 is required/requested.   Insurance verification completed.   The patient is insured through [employer] .   Per test claim: see encounter 07/25/23

## 2023-08-21 NOTE — Progress Notes (Signed)
 Subjective:    Patient ID: Katherine Schwartz, female    DOB: 1965-12-29, 58 y.o.   MRN: [id number]  HPI  Wt Readings from Last 3 Encounters:  08/21/23 216 lb (98 kg)  04/01/23 225 lb 6 oz (102.2 kg)  01/09/23 216 lb 2 oz (98 kg)   38.88 kg/m  Vitals:   08/21/23 0928  BP: 126/84  Pulse: 92  Temp: 98.6 F (37 C)  SpO2: 99%   Pt presents for follow up of obesity after starting wegovy     Today bmi is down to 38.8  Lost 9 lb   Wegovy  1.7 mg weekly (07/24/23)  Has taken 3 shots so far   Has done well overall  A little nausea-tolerating it (when time to eat)  Wants to get into meal prep  Few headaches  Staying very hydrated   Smaller meals  Making some smoothies  More veggies and fruit now Fruit instead of sweets   Occational craves sweets - a little less   Protein  Eggs  Chicken  Fish  Some bacon- cut back on that  Some beans- they bothered her GI system   Doing weight training class-gym  Silver sneakers exercise class  Uses hand weights at work as well   Got inserts for shoes so less foot pain       Patient Active Problem List   Diagnosis Date Noted   Diabetes mellitus screening 04/01/2023   Uterine fibroid 12/03/2016   Screen for STD (sexually transmitted disease) 12/03/2016   Special screening for malignant neoplasms, colon 06/07/2014   Encounter for routine gynecological examination 06/28/2011   Routine general medical examination at a health care facility 06/26/2010   Morbid obesity (HCC) 08/03/2007   Hyperlipidemia, unspecified 05/01/2007   Allergic rhinitis 05/01/2007   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   Past Medical History:  Diagnosis Date   Acne    Allergy 2001   allergic rhinitis   Hyperlipidemia    Obesity    Past Surgical History:  Procedure Laterality Date   CESAREAN SECTION  1994   EXTRACORPOREAL SHOCK WAVE LITHOTRIPSY Left 02/26/2018   Procedure: EXTRACORPOREAL SHOCK WAVE LITHOTRIPSY (ESWL);  Surgeon: Penne Knee, MD;   Location: [facility];  Service: Urology;  Laterality: Left;   Social History   Tobacco Use   Smoking status: Never   Smokeless tobacco: Never  Substance Use Topics   Alcohol use: Yes    Comment: Couple of times a month   Drug use: No   Family History  Problem Relation Age of Onset   Colon cancer Maternal Grandmother 77   Hypertension Mother    Hyperlipidemia Mother    Anxiety disorder Mother    Cancer Maternal Aunt        cervical/ovarian CA   Early death Maternal Aunt    Cancer Paternal Aunt        breast CA   Diabetes Father    Obesity Father    Esophageal cancer Neg Hx    Stomach cancer Neg Hx    Rectal cancer Neg Hx    Bladder Cancer Neg Hx    Kidney cancer Neg Hx    Breast cancer Neg Hx    Allergies  Allergen Reactions   Lavender Oil Hives   Other Nausea And Vomiting    PINE NUTS IN PESTO    Current Outpatient Medications on File Prior to Visit  Medication Sig Dispense Refill   cetirizine-pseudoephedrine (ZYRTEC-D) 5-120 MG tablet Take 1 tablet by mouth daily  as needed.     fluticasone (CUTIVATE) 0.05 % cream Apply 1 Application topically daily as needed.     pimecrolimus (ELIDEL) 1 % cream Apply 1 Application topically daily as needed.     rosuvastatin  (CRESTOR ) 10 MG tablet Take 1 tablet (10 mg total) by mouth daily. 90 tablet 2   No current facility-administered medications on file prior to visit.    Review of Systems  Constitutional:  Negative for activity change, appetite change, fatigue, fever and unexpected weight change.  HENT:  Negative for congestion, rhinorrhea, sore throat and trouble swallowing.   Eyes:  Negative for pain, redness, itching and visual disturbance.  Respiratory:  Negative for cough, chest tightness, shortness of breath and wheezing.   Cardiovascular:  Negative for chest pain and palpitations.  Gastrointestinal:  Positive for nausea. Negative for abdominal pain, blood in stool, constipation and diarrhea.       Occational nausea    Endocrine: Negative for cold intolerance, heat intolerance, polydipsia and polyuria.  Genitourinary:  Negative for difficulty urinating, dysuria, frequency and urgency.  Musculoskeletal:  Negative for arthralgias, joint swelling and myalgias.       Foot pain is improved   Skin:  Negative for pallor and rash.  Neurological:  Positive for headaches. Negative for dizziness, tremors, weakness and numbness.  Hematological:  Negative for adenopathy. Does not bruise/bleed easily.  Psychiatric/Behavioral:  Negative for decreased concentration and dysphoric mood. The patient is not nervous/anxious.        Objective:   Physical Exam Constitutional:      General: She is not in acute distress.    Appearance: Normal appearance. She is well-developed. She is obese. She is not ill-appearing or diaphoretic.  HENT:     Head: Normocephalic and atraumatic.  Eyes:     Conjunctiva/sclera: Conjunctivae normal.     Pupils: Pupils are equal, round, and reactive to light.  Neck:     Thyroid : No thyromegaly.     Vascular: No carotid bruit or JVD.  Cardiovascular:     Rate and Rhythm: Normal rate and regular rhythm.     Heart sounds: Normal heart sounds.     No gallop.  Pulmonary:     Effort: Pulmonary effort is normal. No respiratory distress.     Breath sounds: Normal breath sounds. No wheezing or rales.  Abdominal:     General: There is no distension or abdominal bruit.     Palpations: Abdomen is soft.  Musculoskeletal:     Cervical back: Normal range of motion and neck supple.     Right lower leg: No edema.     Left lower leg: No edema.  Lymphadenopathy:     Cervical: No cervical adenopathy.  Skin:    General: Skin is warm and dry.     Coloration: Skin is not pale.     Findings: No rash.  Neurological:     Mental Status: She is alert.     Coordination: Coordination normal.     Deep Tendon Reflexes: Reflexes are normal and symmetric. Reflexes normal.  Psychiatric:        Mood and Affect:  Mood normal.           Assessment & Plan:   Problem List Items Addressed This Visit       Other   Morbid obesity (HCC) - Primary   Doing well with wegovy  1.7 mg weekly -wants to stay on this dose for a while before increasing it  Tolerates fairly / mild nausea  in between meals  Has vacation coming up  Discussed food choices Needs to increase protein intake for meals and snacks-reviewed options  Small meals Continue strength training exercise   Pt will call if /when she wants to increase dose         Relevant Medications   Semaglutide -Weight Management (WEGOVY ) 1.7 MG/0.75ML SOAJ

## 2023-09-15 ENCOUNTER — Telehealth: Payer: Self-pay | Admitting: *Deleted

## 2023-09-15 MED ORDER — WEGOVY 2.4 MG/0.75ML ~~LOC~~ SOAJ: 2.4000 mg | SUBCUTANEOUS | 0 refills | Status: DC

## 2023-09-15 NOTE — Telephone Encounter (Signed)
 Copied from CRM #[id number]. Topic: Clinical - Medication Question >> Sep 15, 2023  2:21 PM Donna E wrote: Reason for CRM: patient calling in asking for higher dosage for the Semaglutide -Weight Management (WEGOVY ) 1.7 MG/0.75ML SOAJ   asking for the  2.0 MG  This would be a new prescription for the patient.   [id number] [address] [place], [place] - [address] [place] [address] Phone: [phone] Fax: [phone]  Patient uses MyChart

## 2023-09-15 NOTE — Telephone Encounter (Signed)
 Last OV was 08/21/23

## 2023-09-15 NOTE — Telephone Encounter (Signed)
Pt notified Rx sent and advised of PCP's comments

## 2023-09-15 NOTE — Telephone Encounter (Signed)
 The next dose up is actually 2.4 (not 2)   I sent it  Let us  know if not tolerated or any problems  Let us  know at end of month if you want to continue this dose

## 2023-09-16 ENCOUNTER — Other Ambulatory Visit (HOSPITAL_COMMUNITY): Payer: Self-pay

## 2023-10-02 ENCOUNTER — Telehealth: Payer: Self-pay | Admitting: *Deleted

## 2023-10-02 NOTE — Telephone Encounter (Signed)
 Copied from CRM #[id number]. Topic: Clinical - Medical Advice >> Oct 02, 2023  1:43 PM Thersia BROCKS wrote: Reason for CRM: Patient called in regarding wanting to leave a message for fluid in ear, took some prescription, but its still coming. wanting to know if she needs to see ENT or should she just schedule with Dr.Tower

## 2023-10-02 NOTE — Telephone Encounter (Signed)
 Please get some more details -thanks  What medicine did she take What symptoms?

## 2023-10-03 ENCOUNTER — Other Ambulatory Visit: Payer: Self-pay | Admitting: Family Medicine

## 2023-10-03 ENCOUNTER — Ambulatory Visit: Payer: Self-pay

## 2023-10-03 DIAGNOSIS — Z1231 Encounter for screening mammogram for malignant neoplasm of breast: Secondary | ICD-10-CM

## 2023-10-03 NOTE — Telephone Encounter (Signed)
 FYI Only or Action Required?: FYI only for provider.  Patient was last seen in primary care on 08/21/2023 by Randeen Laine LABOR, MD.  Called Nurse Triage reporting No chief complaint on file..  Symptoms began several weeks ago.  Interventions attempted: Prescription medications: prednisone, benadryl , zyrtec nasal.  Symptoms are: unchanged.  Triage Disposition: No disposition on file.  Patient/caregiver understands and will follow disposition?:  Reason for Disposition  [1] Ear congestion lasts > 3 days AND [2] no improvement after using Care Advice  (Exception: Ear congestion is a chronic symptom.)  Answer Assessment - Initial Assessment Questions Was tx with prednisone by virtual provider through her workplace. Completed prednisone on Tuesday. Also taking zyrtec nasal and benadryl  at night with no improvement. Denies vertigo symptoms. Next OV is Monday, pt preferred to go [facility] tomorrow. This RN assisted pt in scheduling an UC visit in [place].  1. LOCATION: Which ear is involved?       Both ears, worse on left  2. SENSATION: Describe how the ear feels. (e.g., stuffy, full, plugged).      Like there is fluid in them  3. ONSET:  When did the ear symptoms start?       2 weeks  4. PAIN: Do you also have an earache? If Yes, ask: How bad is it? (Scale 0-10; none, mild, moderate or severe)     Occasional discomfort  5. CAUSE: What do you think is causing the ear congestion? (e.g., common cold, nasal allergies, recent flight, recent snorkeling)     Unknown  Protocols used: Ear - Congestion-A-AH Copied from CRM [id number]. Topic: Clinical - Medical Advice >> Oct 03, 2023  2:53 PM Dedra B wrote: Reason for CRM: Pt is following up on yesterday's call regarding fluid in her ears. She took prednisone for 5 days that ended last Tuesday. She's been using daily zyrtec D nasal spray daily and taking a histamine before bed. She wants to know if she should just go to urgent care? Pt  noted that if she didn't hear anything by today then she would probably go to urgent care tomorrow.

## 2023-10-03 NOTE — Telephone Encounter (Signed)
 Pt was triaged. See Triage note

## 2023-10-03 NOTE — Telephone Encounter (Signed)
 See triage note.

## 2023-10-03 NOTE — Telephone Encounter (Signed)
 I would recommend urgent care over the weekend Thanks  Will watch for correspondence

## 2023-10-04 ENCOUNTER — Ambulatory Visit
Admission: RE | Admit: 2023-10-04 | Discharge: 2023-10-04 | Disposition: A | Discharge status: Home / Self Care | Payer: Self-pay | Attending: Emergency Medicine

## 2023-10-04 VITALS — BP 123/84 | HR 88 | Temp 98.8°F | Resp 19

## 2023-10-04 DIAGNOSIS — H9203 Otalgia, bilateral: Secondary | ICD-10-CM

## 2023-10-04 DIAGNOSIS — H6993 Unspecified Eustachian tube disorder, bilateral: Secondary | ICD-10-CM | POA: Diagnosis not present

## 2023-10-04 MED ORDER — FLUTICASONE PROPIONATE 50 MCG/ACT NA SUSP: 1.0000 | Freq: Every day | NASAL | 0 refills | Status: DC

## 2023-10-04 MED ORDER — GUAIFENESIN ER 600 MG PO TB12: 1200.0000 mg | ORAL_TABLET | Freq: Two times a day (BID) | ORAL | 0 refills | Status: DC | PRN

## 2023-10-04 NOTE — ED Triage Notes (Signed)
 Patient to Urgent Care with complaints of bilateral ear fullness.  Symptoms x2 weeks. Completed a telehealth visit with MD live and prescribed prednisone with no relief (finished Tuesday).  Taking zyrtec-D/ saline.

## 2023-10-04 NOTE — ED Provider Notes (Signed)
 [facility]    CSN: [id number] Arrival date & time: 10/04/23  1225      History   Chief Complaint Chief Complaint  Patient presents with   Ear Drainage    Entered by patient    HPI Katherine Schwartz is a 58 y.o. female.  Patient presents with 2-week history of bilateral ear discomfort and fullness.  She had a telehealth visit and was treated with 5 days of prednisone without relief.  She also has been treating her symptoms with Zyrtec.  No fever, sore throat, congestion, cough, shortness of breath, vomiting, diarrhea.  The history is provided by the patient and medical records.    Past Medical History:  Diagnosis Date   Acne    Allergy 2001   allergic rhinitis   Hyperlipidemia    Obesity     Patient Active Problem List   Diagnosis Date Noted   Diabetes mellitus screening 04/01/2023   Uterine fibroid 12/03/2016   Screen for STD (sexually transmitted disease) 12/03/2016   Special screening for malignant neoplasms, colon 06/07/2014   Encounter for routine gynecological examination 06/28/2011   Routine general medical examination at a health care facility 06/26/2010   Morbid obesity (HCC) 08/03/2007   Hyperlipidemia, unspecified 05/01/2007   Allergic rhinitis 05/01/2007   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    Past Surgical History:  Procedure Laterality Date   CESAREAN SECTION  1994   EXTRACORPOREAL SHOCK WAVE LITHOTRIPSY Left 02/26/2018   Procedure: EXTRACORPOREAL SHOCK WAVE LITHOTRIPSY (ESWL);  Surgeon: Penne Knee, MD;  Location: [facility] ORS;  Service: Urology;  Laterality: Left;    OB History     Gravida  3   Para  1   Term  1   Preterm      AB  2   Living  1      SAB  1   IAB  1   Ectopic      Multiple      Live Births  1            Home Medications    Prior to Admission medications   Medication Sig Start Date End Date Taking? Authorizing Provider  fluticasone  (FLONASE ) 50 MCG/ACT nasal spray Place 1 spray into both nostrils  daily. 10/04/23  Yes Corlis Burnard DEL, NP  guaiFENesin  (MUCINEX ) 600 MG 12 hr tablet Take 2 tablets (1,200 mg total) by mouth 2 (two) times daily as needed. 10/04/23  Yes Corlis Burnard DEL, NP  cetirizine-pseudoephedrine (ZYRTEC-D) 5-120 MG tablet Take 1 tablet by mouth daily as needed.    [provider]  fluticasone  (CUTIVATE ) 0.05 % cream Apply 1 Application topically daily as needed. 01/06/23   [provider]  pimecrolimus (ELIDEL) 1 % cream Apply 1 Application topically daily as needed. 01/06/23   [provider]  rosuvastatin  (CRESTOR ) 10 MG tablet Take 1 tablet (10 mg total) by mouth daily. 03/07/23   Tower, Laine LABOR, MD  Semaglutide -Weight Management (WEGOVY ) 1.7 MG/0.75ML SOAJ Inject 1.7 mg into the skin once a week. 08/21/23   Tower, Laine LABOR, MD  semaglutide -weight management (WEGOVY ) 2.4 MG/0.75ML SOAJ SQ injection Inject 2.4 mg into the skin once a week. 09/15/23   Tower, Laine LABOR, MD    Family History Family History  Problem Relation Age of Onset   Colon cancer Maternal Grandmother 37   Hypertension Mother    Hyperlipidemia Mother    Anxiety disorder Mother    Cancer Maternal Aunt        cervical/ovarian  CA   Early death Maternal Aunt    Cancer Paternal Aunt        breast CA   Diabetes Father    Obesity Father    Esophageal cancer Neg Hx    Stomach cancer Neg Hx    Rectal cancer Neg Hx    Bladder Cancer Neg Hx    Kidney cancer Neg Hx    Breast cancer Neg Hx     Social History Social History   Tobacco Use   Smoking status: Never   Smokeless tobacco: Never  Substance Use Topics   Alcohol use: Yes    Comment: Couple of times a month   Drug use: No     Allergies   Lavender oil and Other   Review of Systems Review of Systems  Constitutional:  Negative for chills and fever.  HENT:  Positive for ear pain. Negative for congestion, rhinorrhea and sore throat.   Respiratory:  Negative for cough and shortness of breath.   Gastrointestinal:  Negative  for diarrhea and vomiting.     Physical Exam Triage Vital Signs ED Triage Vitals  Encounter Vitals Group     BP      Girls Systolic BP Percentile      Girls Diastolic BP Percentile      Boys Systolic BP Percentile      Boys Diastolic BP Percentile      Pulse      Resp      Temp      Temp src      SpO2      Weight      Height      Head Circumference      Peak Flow      Pain Score      Pain Loc      Pain Education      Exclude from Growth Chart    No data found.  Updated Vital Signs BP 123/84   Pulse 88   Temp 98.8 F (37.1 C)   Resp 19   LMP 10/29/2019   SpO2 99%   Visual Acuity Right Eye Distance:   Left Eye Distance:   Bilateral Distance:    Right Eye Near:   Left Eye Near:    Bilateral Near:     Physical Exam Constitutional:      General: She is not in acute distress. HENT:     Right Ear: Tympanic membrane and ear canal normal.     Left Ear: Tympanic membrane and ear canal normal.     Nose: Nose normal.     Mouth/Throat:     Mouth: Mucous membranes are moist.     Pharynx: Oropharynx is clear.  Cardiovascular:     Rate and Rhythm: Normal rate and regular rhythm.     Heart sounds: Normal heart sounds.  Pulmonary:     Effort: Pulmonary effort is normal. No respiratory distress.     Breath sounds: Normal breath sounds.  Neurological:     Mental Status: She is alert.      UC Treatments / Results  Labs (all labs ordered are listed, but only abnormal results are displayed) Labs Reviewed - No data to display  EKG   Radiology No results found.  Procedures Procedures (including critical care time)  Medications Ordered in UC Medications - No data to display  Initial Impression / Assessment and Plan / UC Course  I have reviewed the triage vital signs and the nursing notes.  Pertinent  labs & imaging results that were available during my care of the patient were reviewed by me and considered in my medical decision making (see chart for  details).    Bilateral otalgia and eustachian tube dysfunction.  Afebrile and vital signs are stable.  Treating with ibuprofen, Mucinex , Flonase  nasal spray, warm compresses.  Education provided on adult earache and eustachian tube dysfunction.  Instructed patient to follow-up with her PCP if she is not improving.  She agrees to plan of care.  Final Clinical Impressions(s) / UC Diagnoses   Final diagnoses:  Otalgia, bilateral  Dysfunction of both eustachian tubes     Discharge Instructions      Take ibuprofen as needed for discomfort.  Take the Mucinex  and Flonase  nasal spray as directed.  Follow-up with your primary care provider if your symptoms are not improving.      ED Prescriptions     Medication Sig Dispense Auth. Provider   guaiFENesin  (MUCINEX ) 600 MG 12 hr tablet Take 2 tablets (1,200 mg total) by mouth 2 (two) times daily as needed. 24 tablet Corlis Burnard DEL, NP   fluticasone  (FLONASE ) 50 MCG/ACT nasal spray Place 1 spray into both nostrils daily. 16 g Corlis Burnard DEL, NP      PDMP not reviewed this encounter.   Corlis Burnard DEL, NP 10/04/23 1254

## 2023-10-04 NOTE — Discharge Instructions (Addendum)
 Take ibuprofen as needed for discomfort.  Take the Mucinex  and Flonase  nasal spray as directed.  Follow-up with your primary care provider if your symptoms are not improving.

## 2023-10-14 ENCOUNTER — Telehealth: Payer: Self-pay | Admitting: Family Medicine

## 2023-10-14 DIAGNOSIS — H9209 Otalgia, unspecified ear: Secondary | ICD-10-CM | POA: Insufficient documentation

## 2023-10-14 DIAGNOSIS — H9203 Otalgia, bilateral: Secondary | ICD-10-CM

## 2023-10-14 NOTE — Addendum Note (Signed)
 Addended by: RANDEEN HARDY A on: 10/14/2023 07:56 PM   Modules accepted: Orders

## 2023-10-14 NOTE — Telephone Encounter (Signed)
 Pt said she hasn't had any new sxs no fever or pain. Pt said the fluid feeling is in both ears but left is worse then right. Pt said she doesn't have a preference either city is fine. Pt advise if she hasn't received a call in 1-2 weeks to let us  know

## 2023-10-14 NOTE — Telephone Encounter (Signed)
 I put the referral in for ENT Please let us  know if you don't hear in 1-2 weeks to set that up (mychart message or call or letter)   Let us  know if symptoms worsen in the meantime

## 2023-10-14 NOTE — Telephone Encounter (Signed)
 Reviewed UC note 9/6  Yes-would refer if not improving Do you pref [place] or [place]?   Any pain/ fever or new symptoms ?

## 2023-10-14 NOTE — Telephone Encounter (Signed)
 Copied from CRM [id number]. Topic: Referral - Question >> Oct 14, 2023  9:35 AM Avram MATSU wrote: Reason for CRM: Patient is still having fluids in her ear and would like to know if she needs a referral to ENT Please advise [phone] (M)

## 2023-10-18 ENCOUNTER — Other Ambulatory Visit: Payer: Self-pay | Admitting: Family Medicine

## 2023-10-20 NOTE — Telephone Encounter (Signed)
 Due for annual exam after 01/09/24-please schedule

## 2023-10-20 NOTE — Telephone Encounter (Signed)
 Wegovy  Last filled:  09/19/23, #3 mL Last OV:  08/21/23, wt mgmt f/u Next OV:  none

## 2023-10-22 ENCOUNTER — Other Ambulatory Visit (HOSPITAL_COMMUNITY): Payer: Self-pay

## 2023-10-22 ENCOUNTER — Telehealth: Payer: Self-pay

## 2023-10-22 NOTE — Telephone Encounter (Signed)
 harmacy Patient Advocate Encounter   Received notification from Onbase that prior authorization for Wegovy  2.4 is required/requested.   Insurance verification completed.   The patient is insured through [employer] .   Per test claim: PA required; PA submitted to above mentioned insurance via Latent Key/confirmation #/EOC [id number] Status is pending

## 2023-11-03 ENCOUNTER — Other Ambulatory Visit (HOSPITAL_COMMUNITY): Payer: Self-pay

## 2023-11-03 ENCOUNTER — Ambulatory Visit

## 2023-11-03 NOTE — Telephone Encounter (Signed)
Left VM letting pharmacy know PA was approved

## 2023-11-03 NOTE — Telephone Encounter (Signed)
 Pharmacy Patient Advocate Encounter  Received notification from [employer]  that Prior Authorization for Wegovy  2.4 has been APPROVED from 09/22/23 to 05/30/24. Ran test claim, Copay is $0.00. This test claim was processed through [facility]- copay amounts may vary at other pharmacies due to pharmacy/plan contracts, or as the patient moves through the different stages of their insurance plan.   PA #/Case ID/Reference #: [id number]

## 2023-11-18 ENCOUNTER — Other Ambulatory Visit: Payer: Self-pay | Admitting: Medical Genetics

## 2023-11-25 ENCOUNTER — Ambulatory Visit
Admission: RE | Admit: 2023-11-25 | Discharge: 2023-11-25 | Disposition: A | Source: Ambulatory Visit | Attending: Family Medicine | Admitting: Family Medicine

## 2023-11-25 DIAGNOSIS — Z1231 Encounter for screening mammogram for malignant neoplasm of breast: Secondary | ICD-10-CM

## 2023-11-29 ENCOUNTER — Ambulatory Visit: Payer: Self-pay | Admitting: Family Medicine

## 2023-12-30 ENCOUNTER — Other Ambulatory Visit

## 2023-12-31 ENCOUNTER — Other Ambulatory Visit: Payer: Self-pay | Admitting: Family Medicine

## 2024-01-04 ENCOUNTER — Telehealth: Payer: Self-pay | Admitting: Family Medicine

## 2024-01-04 DIAGNOSIS — Z131 Encounter for screening for diabetes mellitus: Secondary | ICD-10-CM

## 2024-01-04 DIAGNOSIS — E7800 Pure hypercholesterolemia, unspecified: Secondary | ICD-10-CM

## 2024-01-04 DIAGNOSIS — Z0000 Encounter for general adult medical examination without abnormal findings: Secondary | ICD-10-CM

## 2024-01-04 NOTE — Telephone Encounter (Signed)
-----   Message from Harlene Du sent at 12/24/2023 10:36 AM EST ----- Regarding: Labs Tues 01/06/24 Hello,  Patient is coming in for CPE labs. Can we get orders please.   Thanks

## 2024-01-06 ENCOUNTER — Ambulatory Visit: Payer: Self-pay | Admitting: Family Medicine

## 2024-01-06 ENCOUNTER — Other Ambulatory Visit

## 2024-01-06 DIAGNOSIS — Z0000 Encounter for general adult medical examination without abnormal findings: Secondary | ICD-10-CM

## 2024-01-06 DIAGNOSIS — E7800 Pure hypercholesterolemia, unspecified: Secondary | ICD-10-CM

## 2024-01-06 DIAGNOSIS — Z131 Encounter for screening for diabetes mellitus: Secondary | ICD-10-CM

## 2024-01-06 LAB — CBC WITH DIFFERENTIAL/PLATELET
Basophils Absolute: 0.1 K/uL (ref 0.0–0.1)
Basophils Relative: 1.4 % (ref 0.0–3.0)
Eosinophils Absolute: 0.2 K/uL (ref 0.0–0.7)
Eosinophils Relative: 5.3 % — ABNORMAL HIGH (ref 0.0–5.0)
HCT: 40.3 % (ref 36.0–46.0)
Hemoglobin: 13.5 g/dL (ref 12.0–15.0)
Lymphocytes Relative: 36.8 % (ref 12.0–46.0)
Lymphs Abs: 1.3 K/uL (ref 0.7–4.0)
MCHC: 33.5 g/dL (ref 30.0–36.0)
MCV: 93.8 fl (ref 78.0–100.0)
Monocytes Absolute: 0.4 K/uL (ref 0.1–1.0)
Monocytes Relative: 9.6 % (ref 3.0–12.0)
Neutro Abs: 1.7 K/uL (ref 1.4–7.7)
Neutrophils Relative %: 46.9 % (ref 43.0–77.0)
Platelets: 202 K/uL (ref 150.0–400.0)
RBC: 4.3 Mil/uL (ref 3.87–5.11)
RDW: 12.8 % (ref 11.5–15.5)
WBC: 3.6 K/uL — ABNORMAL LOW (ref 4.0–10.5)

## 2024-01-06 LAB — COMPREHENSIVE METABOLIC PANEL WITH GFR
ALT: 25 U/L (ref 0–35)
AST: 22 U/L (ref 0–37)
Albumin: 4.3 g/dL (ref 3.5–5.2)
Alkaline Phosphatase: 63 U/L (ref 39–117)
BUN: 14 mg/dL (ref 6–23)
CO2: 29 meq/L (ref 19–32)
Calcium: 9.9 mg/dL (ref 8.4–10.5)
Chloride: 105 meq/L (ref 96–112)
Creatinine, Ser: 0.55 mg/dL (ref 0.40–1.20)
GFR: 99.77 mL/min (ref >60.00)
Glucose, Bld: 85 mg/dL (ref 70–99)
Potassium: 3.8 meq/L (ref 3.5–5.1)
Sodium: 142 meq/L (ref 135–145)
Total Bilirubin: 0.6 mg/dL (ref 0.2–1.2)
Total Protein: 7.2 g/dL (ref 6.0–8.3)

## 2024-01-06 LAB — LIPID PANEL
Cholesterol: 170 mg/dL (ref 0–200)
HDL: 63.5 mg/dL (ref >39.00)
LDL Cholesterol: 87 mg/dL (ref 0–99)
NonHDL: 106.97
Total CHOL/HDL Ratio: 3
Triglycerides: 102 mg/dL (ref 0.0–149.0)
VLDL: 20.4 mg/dL (ref 0.0–40.0)

## 2024-01-06 LAB — HEMOGLOBIN A1C: Hgb A1c MFr Bld: 5.3 % (ref 4.6–6.5)

## 2024-01-06 LAB — TSH: TSH: 1.29 u[IU]/mL (ref 0.35–5.50)

## 2024-01-13 ENCOUNTER — Encounter: Payer: Self-pay | Admitting: Family Medicine

## 2024-01-13 ENCOUNTER — Ambulatory Visit: Admitting: Family Medicine

## 2024-01-13 VITALS — BP 128/81 | HR 82 | Temp 98.0°F | Ht 62.25 in | Wt 208.2 lb

## 2024-01-13 DIAGNOSIS — Z131 Encounter for screening for diabetes mellitus: Secondary | ICD-10-CM

## 2024-01-13 DIAGNOSIS — E7800 Pure hypercholesterolemia, unspecified: Secondary | ICD-10-CM

## 2024-01-13 DIAGNOSIS — Z1211 Encounter for screening for malignant neoplasm of colon: Secondary | ICD-10-CM

## 2024-01-13 DIAGNOSIS — Z0000 Encounter for general adult medical examination without abnormal findings: Secondary | ICD-10-CM

## 2024-01-13 MED ORDER — WEGOVY 2.4 MG/0.75ML ~~LOC~~ SOAJ: 2.4000 mg | SUBCUTANEOUS | 1 refills | Status: AC

## 2024-01-13 NOTE — Assessment & Plan Note (Signed)
 Discussed how this problem influences overall health and the risks it imposes  Reviewed plan for weight loss with lower calorie diet (via better food choices (lower glycemic and portion control) along with exercise building up to or more than 30 minutes 5 days per week including some aerobic activity and strength training   Tolerating semaglutide  2.4 mg weekly Bmi down to 37.7

## 2024-01-13 NOTE — Assessment & Plan Note (Signed)
 Disc goals for lipids and reasons to control them Rev last labs with pt Rev low sat fat diet in detail Improved  LDL down to 87 Commended   Continues crestor  10 mg daily

## 2024-01-13 NOTE — Patient Instructions (Addendum)
 Stay as active as possible Add some strength training to your routine, this is important for bone and brain health and can reduce your risk of falls and help your body use insulin properly and regulate weight  Light weights, exercise bands , and internet videos are a good way to start  Yoga (chair or regular), machines , floor exercises or a gym with machines are also good options    Check your vitamin label Make sure you get 2000 international units vitamin D daily   Keep up the good work with diet and exercise   In the future if blood pressure goes up we may need to change from zyrtec D to plain zyrtec without the D

## 2024-01-13 NOTE — Assessment & Plan Note (Signed)
 Lab Results  Component Value Date   HGBA1C 5.3 01/06/2024   HGBA1C 5.5 04/01/2023   Improved disc imp of low glycemic diet and wt loss to prevent DM2  Continues semaglutide  for weight management

## 2024-01-13 NOTE — Progress Notes (Signed)
 Subjective:    Patient ID: Katherine Schwartz, female    DOB: 03-10-1965, 58 y.o.   MRN: [id number]  HPI  Here for health maintenance exam and to review chronic medical problems   Wt Readings from Last 3 Encounters:  01/13/24 208 lb 4 oz (94.5 kg)  08/21/23 216 lb (98 kg)  04/01/23 225 lb 6 oz (102.2 kg)   37.78 kg/m  Vitals:   01/13/24 0958 01/13/24 1025  BP: (!) 136/92 128/81  Pulse: 82   Temp: 98 F (36.7 C)   SpO2: 99%     Immunization History  Administered Date(s) Administered   Influenza Inj Mdck Quad Pf 10/04/2020   Influenza Whole 01/29/2004, 10/29/2006   Influenza, Quadrivalent, Recombinant, Inj, Pf 09/28/2016   Influenza, Seasonal, Injecte, Preservative Fre 10/21/2014, 10/24/2023   Influenza,inj,Quad PF,6+ Mos 09/13/2017, 10/07/2018, 09/27/2019, 11/09/2021   Influenza-Unspecified 10/28/2012, 10/29/2013, 10/17/2015, 09/03/2022   PFIZER Comirnaty(Gray Top)Covid-19 Tri-Sucrose Vaccine 05/24/2020   PFIZER(Purple Top)SARS-COV-2 Vaccination 04/29/2019, 06/27/2019, 12/11/2019   Pfizer(Comirnaty)Fall Seasonal Vaccine 12 years and older 11/09/2021   Td 02/28/2005   Tdap 11/27/2015   Zoster Recombinant(Shingrix) 09/03/2022, 11/27/2022    Health Maintenance Due  Topic Date Due   Pneumococcal Vaccine: 50+ Years (1 of 1 - PCV) Never done   Just came off of a break  Got some projects done   Taking wegovy  2.4 mg for obesity  Weight is down to 208  Does eat smaller portions and cravings not as bad  Quiets the food noise  Pushing water intake  Eating more protein (lean) , healthy high protein meals  More veggies  More meal prepping   Mammogram 10/2023 Self breast exam- no lumps   Gyn health Pap 12/2022 normal with neg HPV and neg std screening  No gyn problems  Is menopausal  Declines STD screening today  Colon cancer screening  Colonoscopy 09/2014 10 y recall   Bone health   Falls-none  Fractures-none  Supplements - mvi for women    Exercise  Did  not use her gym membership Will switch to [employer] -more convenient   Has walk fit app- some short programs     Mood    01/13/2024   10:06 AM 08/21/2023   10:10 AM 04/01/2023    9:03 AM 01/09/2023    8:42 AM 01/03/2021   11:44 AM  Depression screen PHQ 2/9  Decreased Interest 0 0 0 0 0  Down, Depressed, Hopeless 0 0 0 0 0  PHQ - 2 Score 0 0 0 0 0  Altered sleeping 1 0 0 0 0  Tired, decreased energy 0 1 1 1  0  Change in appetite 0 0 0 0 0  Feeling bad or failure about yourself  0 0 0 0 0  Trouble concentrating 0 0 0 1 0  Moving slowly or fidgety/restless 0 0 0 0 0  Suicidal thoughts 0 0 0 0 0  PHQ-9 Score 1 1  1  2   0   Difficult doing work/chores Not difficult at all Not difficult at all Somewhat difficult  Not difficult at all     Data saved with a previous flowsheet row definition   Blood pressure BP Readings from Last 3 Encounters:  01/13/24 128/81  10/04/23 123/84  08/21/23 126/84   Lab Results  Component Value Date   NA 142 01/06/2024   K 3.8 01/06/2024   CO2 29 01/06/2024   GLUCOSE 85 01/06/2024   BUN 14 01/06/2024   CREATININE 0.55 01/06/2024  CALCIUM  9.9 01/06/2024   GFR 99.77 01/06/2024   GFRNONAA 138.67 05/17/2009    DM screen Lab Results  Component Value Date   HGBA1C 5.3 01/06/2024   HGBA1C 5.5 04/01/2023    Hyperlipidemia Lab Results  Component Value Date   CHOL 170 01/06/2024   CHOL 182 02/28/2023   CHOL 250 (H) 01/09/2023   Lab Results  Component Value Date   HDL 63.50 01/06/2024   HDL 59.70 02/28/2023   HDL 58.60 01/09/2023   Lab Results  Component Value Date   LDLCALC 87 01/06/2024   LDLCALC 97 02/28/2023   LDLCALC 153 (H) 01/09/2023   Lab Results  Component Value Date   TRIG 102.0 01/06/2024   TRIG 125.0 02/28/2023   TRIG 191.0 (H) 01/09/2023   Lab Results  Component Value Date   CHOLHDL 3 01/06/2024   CHOLHDL 3 02/28/2023   CHOLHDL 4 01/09/2023   Lab Results  Component Value Date   LDLDIRECT 157.0 01/09/2023    LDLDIRECT 157.0 12/31/2021   LDLDIRECT 172.0 12/05/2017   Crestor  10 mg daily  Tolerates well Improved across the board  Lab Results  Component Value Date   ALT 25 01/06/2024   AST 22 01/06/2024   ALKPHOS 63 01/06/2024   BILITOT 0.6 01/06/2024    Lab Results  Component Value Date   TSH 1.29 01/06/2024   Lab Results  Component Value Date   WBC 3.6 (L) 01/06/2024   HGB 13.5 01/06/2024   HCT 40.3 01/06/2024   MCV 93.8 01/06/2024   PLT 202.0 01/06/2024      Patient Active Problem List   Diagnosis Date Noted   Diabetes mellitus screening 04/01/2023   Uterine fibroid 12/03/2016   Screen for STD (sexually transmitted disease) 12/03/2016   Special screening for malignant neoplasms, colon 06/07/2014   Encounter for routine gynecological examination 06/28/2011   Routine general medical examination at a health care facility 06/26/2010   Morbid obesity (HCC) 08/03/2007   Hyperlipidemia, unspecified 05/01/2007   Allergic rhinitis 05/01/2007   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   Past Medical History:  Diagnosis Date   Acne    Allergy 2001   allergic rhinitis   Hyperlipidemia    Obesity    Past Surgical History:  Procedure Laterality Date   CESAREAN SECTION  1994   EXTRACORPOREAL SHOCK WAVE LITHOTRIPSY Left 02/26/2018   Procedure: EXTRACORPOREAL SHOCK WAVE LITHOTRIPSY (ESWL);  Surgeon: Penne Knee, MD;  Location: [facility];  Service: Urology;  Laterality: Left;   Social History[1] Family History  Problem Relation Age of Onset   Colon cancer Maternal Grandmother 68   Hypertension Mother    Hyperlipidemia Mother    Anxiety disorder Mother    Cancer Maternal Aunt        cervical/ovarian CA   Early death Maternal Aunt    Cancer Paternal Aunt        breast CA   Diabetes Father    Obesity Father    Esophageal cancer Neg Hx    Stomach cancer Neg Hx    Rectal cancer Neg Hx    Bladder Cancer Neg Hx    Kidney cancer Neg Hx    Breast cancer Neg Hx     Allergies[2] Medications Ordered Prior to Encounter[3]  Review of Systems  Constitutional:  Negative for activity change, appetite change, fatigue, fever and unexpected weight change.  HENT:  Negative for congestion, ear pain, rhinorrhea, sinus pressure and sore throat.   Eyes:  Negative for pain, redness and visual disturbance.  Respiratory:  Negative for cough, shortness of breath and wheezing.   Cardiovascular:  Negative for chest pain and palpitations.  Gastrointestinal:  Negative for abdominal pain, blood in stool, constipation and diarrhea.  Endocrine: Negative for polydipsia and polyuria.  Genitourinary:  Negative for dysuria, frequency and urgency.  Musculoskeletal:  Negative for arthralgias, back pain and myalgias.  Skin:  Negative for pallor and rash.  Allergic/Immunologic: Negative for environmental allergies.  Neurological:  Negative for dizziness, syncope and headaches.  Hematological:  Negative for adenopathy. Does not bruise/bleed easily.  Psychiatric/Behavioral:  Negative for decreased concentration and dysphoric mood. The patient is not nervous/anxious.        Objective:   Physical Exam Constitutional:      General: She is not in acute distress.    Appearance: Normal appearance. She is well-developed. She is obese. She is not ill-appearing or diaphoretic.  HENT:     Head: Normocephalic and atraumatic.     Right Ear: Tympanic membrane, ear canal and external ear normal.     Left Ear: Tympanic membrane, ear canal and external ear normal.     Nose: Nose normal. No congestion.     Mouth/Throat:     Mouth: Mucous membranes are moist.     Pharynx: Oropharynx is clear. No posterior oropharyngeal erythema.  Eyes:     General: No scleral icterus.    Extraocular Movements: Extraocular movements intact.     Conjunctiva/sclera: Conjunctivae normal.     Pupils: Pupils are equal, round, and reactive to light.  Neck:     Thyroid : No thyromegaly.     Vascular: No carotid  bruit or JVD.  Cardiovascular:     Rate and Rhythm: Normal rate and regular rhythm.     Pulses: Normal pulses.     Heart sounds: Normal heart sounds.     No gallop.  Pulmonary:     Effort: Pulmonary effort is normal. No respiratory distress.     Breath sounds: Normal breath sounds. No wheezing.     Comments: Good air exch Chest:     Chest wall: No tenderness.  Abdominal:     General: Bowel sounds are normal. There is no distension or abdominal bruit.     Palpations: Abdomen is soft. There is no mass.     Tenderness: There is no abdominal tenderness.     Hernia: No hernia is present.  Genitourinary:    Comments: Breast exam: No mass, nodules, thickening, tenderness, bulging, retraction, inflamation, nipple discharge or skin changes noted.  No axillary or clavicular LA.     Musculoskeletal:        General: No tenderness. Normal range of motion.     Cervical back: Normal range of motion and neck supple. No rigidity. No muscular tenderness.     Right lower leg: No edema.     Left lower leg: No edema.     Comments: No kyphosis   Lymphadenopathy:     Cervical: No cervical adenopathy.  Skin:    General: Skin is warm and dry.     Coloration: Skin is not pale.     Findings: No erythema or rash.     Comments: Lentigines -face/back   Some tag moles   Neurological:     Mental Status: She is alert. Mental status is at baseline.     Cranial Nerves: No cranial nerve deficit.     Motor: No abnormal muscle tone.     Coordination: Coordination normal.     Gait: Gait normal.  Deep Tendon Reflexes: Reflexes are normal and symmetric. Reflexes normal.  Psychiatric:        Mood and Affect: Mood normal.        Cognition and Memory: Cognition and memory normal.           Assessment & Plan:   Problem List Items Addressed This Visit       Other   Special screening for malignant neoplasms, colon   Colonoscopy 09/2014 with 10 y recall       Routine general medical examination at a  health care facility - Primary   Reviewed health habits including diet and exercise and skin cancer prevention Reviewed appropriate screening tests for age  Also reviewed health mt list, fam hx and immunization status , as well as social and family history   See HPI Labs reviewed and ordered Health Maintenance  Topic Date Due   Pneumococcal Vaccine for age over 55 (1 of 1 - PCV) Never done   COVID-19 Vaccine (6 - 2025-26 season) 01/28/2025*   Colon Cancer Screening  09/29/2024   Breast Cancer Screening  11/24/2024   DTaP/Tdap/Td vaccine (3 - Td or Tdap) 11/26/2025   Pap with HPV screening  01/09/2028   Flu Shot  Completed   Hepatitis C Screening  Completed   HIV Screening  Completed   Zoster (Shingles) Vaccine  Completed   Hepatitis B Vaccine  Aged Out   HPV Vaccine  Aged Out   Meningitis B Vaccine  Aged Out  *Topic was postponed. The date shown is not the original due date.    Commended weight loss  Discussed fall prevention, supplements and exercise for bone density  {HQ 1       Morbid obesity (HCC)   Discussed how this problem influences overall health and the risks it imposes  Reviewed plan for weight loss with lower calorie diet (via better food choices (lower glycemic and portion control) along with exercise building up to or more than 30 minutes 5 days per week including some aerobic activity and strength training   Tolerating semaglutide  2.4 mg weekly Bmi down to 37.7         Relevant Medications   semaglutide -weight management (WEGOVY ) 2.4 MG/0.75ML SOAJ SQ injection   Hyperlipidemia, unspecified   Disc goals for lipids and reasons to control them Rev last labs with pt Rev low sat fat diet in detail Improved  LDL down to 87 Commended   Continues crestor  10 mg daily       Diabetes mellitus screening   Lab Results  Component Value Date   HGBA1C 5.3 01/06/2024   HGBA1C 5.5 04/01/2023   Improved disc imp of low glycemic diet and wt loss to prevent DM2   Continues semaglutide  for weight management         [1]  Social History Tobacco Use   Smoking status: Never   Smokeless tobacco: Never  Substance Use Topics   Alcohol use: Yes    Comment: Couple of times a month   Drug use: No  [2]  Allergies Allergen Reactions   Lavender Oil Hives   Other Nausea And Vomiting    PINE NUTS IN PESTO   [3]  Current Outpatient Medications on File Prior to Visit  Medication Sig Dispense Refill   cetirizine-pseudoephedrine (ZYRTEC-D) 5-120 MG tablet Take 1 tablet by mouth daily as needed.     fluticasone  (CUTIVATE ) 0.05 % cream Apply 1 Application topically daily as needed.     rosuvastatin  (CRESTOR ) 10  MG tablet TAKE 1 TABLET DAILY 90 tablet 0   No current facility-administered medications on file prior to visit.

## 2024-01-13 NOTE — Assessment & Plan Note (Signed)
 Colonoscopy 09/2014 with 10 y recall

## 2024-01-13 NOTE — Assessment & Plan Note (Signed)
 Reviewed health habits including diet and exercise and skin cancer prevention Reviewed appropriate screening tests for age  Also reviewed health mt list, fam hx and immunization status , as well as social and family history   See HPI Labs reviewed and ordered Health Maintenance  Topic Date Due   Pneumococcal Vaccine for age over 68 (1 of 1 - PCV) Never done   COVID-19 Vaccine (6 - 2025-26 season) 01/28/2025*   Colon Cancer Screening  09/29/2024   Breast Cancer Screening  11/24/2024   DTaP/Tdap/Td vaccine (3 - Td or Tdap) 11/26/2025   Pap with HPV screening  01/09/2028   Flu Shot  Completed   Hepatitis C Screening  Completed   HIV Screening  Completed   Zoster (Shingles) Vaccine  Completed   Hepatitis B Vaccine  Aged Out   HPV Vaccine  Aged Out   Meningitis B Vaccine  Aged Out  *Topic was postponed. The date shown is not the original due date.    Commended weight loss  Discussed fall prevention, supplements and exercise for bone density  {HQ 1

## 2024-02-03 ENCOUNTER — Other Ambulatory Visit: Payer: Self-pay | Admitting: Medical Genetics

## 2024-02-03 DIAGNOSIS — Z006 Encounter for examination for normal comparison and control in clinical research program: Secondary | ICD-10-CM

## 2024-02-23 LAB — GENECONNECT MOLECULAR SCREEN: Genetic Analysis Overall Interpretation: NEGATIVE
